# Patient Record
Sex: Male | Born: 1950 | Race: White | Hispanic: No | Marital: Single | Smoking: Former smoker
Health system: Southern US, Community
[De-identification: ages and names within clinical notes are randomized; demographics above are authoritative.]

## PROBLEM LIST (undated history)

## (undated) DIAGNOSIS — R112 Nausea with vomiting, unspecified: Secondary | ICD-10-CM

## (undated) DIAGNOSIS — M5126 Other intervertebral disc displacement, lumbar region: Secondary | ICD-10-CM

## (undated) DIAGNOSIS — N400 Enlarged prostate without lower urinary tract symptoms: Secondary | ICD-10-CM

## (undated) DIAGNOSIS — E785 Hyperlipidemia, unspecified: Secondary | ICD-10-CM

## (undated) DIAGNOSIS — F418 Other specified anxiety disorders: Secondary | ICD-10-CM

## (undated) DIAGNOSIS — M419 Scoliosis, unspecified: Secondary | ICD-10-CM

## (undated) DIAGNOSIS — M5136 Other intervertebral disc degeneration, lumbar region: Secondary | ICD-10-CM

## (undated) DIAGNOSIS — I10 Essential (primary) hypertension: Secondary | ICD-10-CM

## (undated) DIAGNOSIS — Z9889 Other specified postprocedural states: Secondary | ICD-10-CM

## (undated) DIAGNOSIS — K219 Gastro-esophageal reflux disease without esophagitis: Secondary | ICD-10-CM

## (undated) DIAGNOSIS — M51369 Other intervertebral disc degeneration, lumbar region without mention of lumbar back pain or lower extremity pain: Secondary | ICD-10-CM

## (undated) DIAGNOSIS — E538 Deficiency of other specified B group vitamins: Secondary | ICD-10-CM

## (undated) DIAGNOSIS — F209 Schizophrenia, unspecified: Secondary | ICD-10-CM

## (undated) DIAGNOSIS — T8859XA Other complications of anesthesia, initial encounter: Secondary | ICD-10-CM

## (undated) DIAGNOSIS — M5030 Other cervical disc degeneration, unspecified cervical region: Secondary | ICD-10-CM

## (undated) HISTORY — DX: Other cervical disc degeneration, unspecified cervical region: M50.30

## (undated) HISTORY — DX: Other specified anxiety disorders: F41.8

## (undated) HISTORY — DX: Other intervertebral disc degeneration, lumbar region: M51.36

## (undated) HISTORY — DX: Benign prostatic hyperplasia without lower urinary tract symptoms: N40.0

## (undated) HISTORY — DX: Deficiency of other specified B group vitamins: E53.8

## (undated) HISTORY — PX: TRACHEAL SURGERY: SHX1096

## (undated) HISTORY — DX: Other intervertebral disc degeneration, lumbar region without mention of lumbar back pain or lower extremity pain: M51.369

## (undated) HISTORY — PX: SPLENECTOMY, TOTAL: SHX788

## (undated) HISTORY — DX: Hyperlipidemia, unspecified: E78.5

## (undated) HISTORY — DX: Gastro-esophageal reflux disease without esophagitis: K21.9

## (undated) HISTORY — DX: Essential (primary) hypertension: I10

---

## 2000-06-09 ENCOUNTER — Ambulatory Visit (HOSPITAL_COMMUNITY): Admission: RE | Admit: 2000-06-09 | Discharge: 2000-06-09 | Payer: Self-pay | Admitting: *Deleted

## 2000-06-09 ENCOUNTER — Encounter: Payer: Self-pay | Admitting: *Deleted

## 2001-08-21 ENCOUNTER — Emergency Department (HOSPITAL_COMMUNITY): Admission: EM | Admit: 2001-08-21 | Discharge: 2001-08-22 | Payer: Self-pay | Admitting: Emergency Medicine

## 2001-08-21 ENCOUNTER — Encounter: Payer: Self-pay | Admitting: Emergency Medicine

## 2001-09-14 ENCOUNTER — Encounter: Payer: Self-pay | Admitting: Emergency Medicine

## 2001-09-14 ENCOUNTER — Emergency Department (HOSPITAL_COMMUNITY): Admission: EM | Admit: 2001-09-14 | Discharge: 2001-09-14 | Payer: Self-pay | Admitting: Emergency Medicine

## 2008-10-29 ENCOUNTER — Emergency Department (HOSPITAL_COMMUNITY): Admission: EM | Admit: 2008-10-29 | Discharge: 2008-10-29 | Payer: Self-pay | Admitting: Emergency Medicine

## 2009-07-07 ENCOUNTER — Ambulatory Visit: Payer: Self-pay | Admitting: Vascular Surgery

## 2009-10-10 ENCOUNTER — Emergency Department (HOSPITAL_COMMUNITY): Admission: EM | Admit: 2009-10-10 | Discharge: 2009-10-10 | Payer: Self-pay | Admitting: Emergency Medicine

## 2009-10-14 ENCOUNTER — Telehealth: Payer: Self-pay | Admitting: Internal Medicine

## 2009-10-14 ENCOUNTER — Encounter: Payer: Self-pay | Admitting: Internal Medicine

## 2009-10-15 DIAGNOSIS — I319 Disease of pericardium, unspecified: Secondary | ICD-10-CM | POA: Insufficient documentation

## 2009-10-15 DIAGNOSIS — K219 Gastro-esophageal reflux disease without esophagitis: Secondary | ICD-10-CM

## 2009-10-15 DIAGNOSIS — I10 Essential (primary) hypertension: Secondary | ICD-10-CM | POA: Insufficient documentation

## 2009-10-16 ENCOUNTER — Ambulatory Visit: Payer: Self-pay | Admitting: Internal Medicine

## 2009-10-16 DIAGNOSIS — F200 Paranoid schizophrenia: Secondary | ICD-10-CM | POA: Insufficient documentation

## 2009-10-16 DIAGNOSIS — J984 Other disorders of lung: Secondary | ICD-10-CM | POA: Insufficient documentation

## 2009-10-17 ENCOUNTER — Ambulatory Visit (HOSPITAL_COMMUNITY): Admission: RE | Admit: 2009-10-17 | Discharge: 2009-10-17 | Payer: Self-pay | Admitting: Family Medicine

## 2009-10-22 ENCOUNTER — Encounter (HOSPITAL_COMMUNITY): Admission: RE | Admit: 2009-10-22 | Discharge: 2009-10-31 | Payer: Self-pay | Admitting: Family Medicine

## 2009-11-04 ENCOUNTER — Encounter (HOSPITAL_COMMUNITY)
Admission: RE | Admit: 2009-11-04 | Discharge: 2009-12-04 | Payer: Self-pay | Source: Home / Self Care | Admitting: Family Medicine

## 2009-12-10 ENCOUNTER — Encounter (HOSPITAL_COMMUNITY)
Admission: RE | Admit: 2009-12-10 | Discharge: 2010-01-09 | Payer: Self-pay | Source: Home / Self Care | Attending: Family Medicine | Admitting: Family Medicine

## 2010-01-31 ENCOUNTER — Emergency Department (HOSPITAL_COMMUNITY)
Admission: EM | Admit: 2010-01-31 | Discharge: 2010-01-31 | Payer: Self-pay | Source: Home / Self Care | Admitting: Emergency Medicine
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
## 2010-03-03 NOTE — Letter (Signed)
Summary: [facility]  [facility]   Imported By: Lester Seligman 10/21/2009 08:50:04  _____________________________________________________________________  External Attachment:    Type:   Image     Comment:   External Document
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
## 2010-04-16 LAB — CBC
HCT: 36.2 % — ABNORMAL LOW (ref 39.0–52.0)
MCH: 30.5 pg (ref 26.0–34.0)
MCV: 88.2 fL (ref 78.0–100.0)
Platelets: 159 10*3/uL (ref 150–400)
RBC: 4.11 MIL/uL — ABNORMAL LOW (ref 4.22–5.81)
WBC: 6.6 10*3/uL (ref 4.0–10.5)

## 2010-04-16 LAB — BASIC METABOLIC PANEL
BUN: 3 mg/dL — ABNORMAL LOW (ref 6–23)
CO2: 26 mEq/L (ref 19–32)
Chloride: 102 mEq/L (ref 96–112)
Creatinine, Ser: 0.73 mg/dL (ref 0.4–1.5)
GFR calc Af Amer: >60 mL/min (ref >60)
Potassium: 3.3 mEq/L — ABNORMAL LOW (ref 3.5–5.1)

## 2010-04-16 LAB — DIFFERENTIAL
Eosinophils Absolute: 0 10*3/uL (ref 0.0–0.7)
Eosinophils Relative: 0 % (ref 0–5)
Lymphocytes Relative: 23 % (ref 12–46)
Lymphs Abs: 1.5 10*3/uL (ref 0.7–4.0)
Monocytes Absolute: 0.4 10*3/uL (ref 0.1–1.0)

## 2010-05-21 ENCOUNTER — Other Ambulatory Visit (HOSPITAL_COMMUNITY): Payer: Self-pay | Admitting: *Deleted

## 2010-05-21 DIAGNOSIS — J929 Pleural plaque without asbestos: Secondary | ICD-10-CM

## 2010-05-21 DIAGNOSIS — R222 Localized swelling, mass and lump, trunk: Secondary | ICD-10-CM

## 2010-05-25 ENCOUNTER — Ambulatory Visit (HOSPITAL_COMMUNITY): Admission: RE | Admit: 2010-05-25 | Payer: Medicare Other | Source: Ambulatory Visit

## 2010-06-16 NOTE — Consult Note (Signed)
NEW PATIENT CONSULTATION   Horrigan, Auguste  DOB:  12/02/1950                                       07/07/2009  CHART#:[id number]   Mr. Jim Collins is a 60 year old male patient referred by Dr. Charm Barges for  venous insufficiency of the right leg.  He has had bulging varicosities  both anteriorly in the lower thigh and pretibial region as well as  posterior calf area for several years.  He had no history of bleeding or  stasis ulcers but does have increasing swelling as the day goes by  particularly in the right ankle and discomfort associated with this  which is a heavy aching discomfort.  Has not worn elastic compression  stockings nor elevated his leg on a regular basis nor taken pain  medication except for his back.  Has no history of her other venous  problems.  Apparently the swelling in his right ankle is becoming an  issue.   PAST MEDICAL HISTORY:  1. Hypertension.  2. Lumbar disk disease.  3. Negative for diabetes, coronary artery disease, COPD or stroke.   FAMILY HISTORY:  Positive for coronary artery disease in mother who died  at a young age of myocardial infarction and diabetes in a brother.  Negative for stroke.   SOCIAL HISTORY:  Single.  He is disabled.  He smoked a pack cigarettes  per day until 15 years ago when he quit.  Drinks occasional beer.   REVIEW OF SYSTEMS:  Positive for weight loss, productive cough, reflux  esophagitis pain in his feet, dizziness, headaches, joint pain and  depression.  He states that he is a free bleeder but does not have a  specific diagnosis of hemophilia.  Review of systems otherwise is  totally negative in all systems.   PHYSICAL EXAMINATION:  Blood pressure 145/97, heart rate 70,  respirations 24.  GENERAL:  He is a well-developed, well-nourished male in no apparent  distress.  He is alert and oriented x3.  HEENT:  Exam is normal.  EOMs intact.  LUNGS:  Clear to auscultation.  No rhonchi or wheezing.  CARDIOVASCULAR:  Regular rhythm.  No murmurs.  ABDOMEN:  Soft, nontender with no masses.  MUSCULOSKELETAL:  Exam is free of major deformities.  NEUROLOGIC:  Normal.  SKIN:  Free of rashes.  Lower extremity exam reveals 3+ femoral, popliteal, and dorsalis pedis  pulses bilaterally.  There are some bulging varicosities in the right  anterior thigh beginning midway between the groin and the knee extending  into the anterior thigh and to the pretibial region around the knee.  He  also has some bulging varicosities in the posterior calf on the right.  The left leg has some small varicosities in the medial calf area in the  greater saphenous system.  There is no distal edema in the left leg but  there is distal edema in the right ankle which measures 1-1/2 cm larger  in circumference compared to the left.  There is mild hyperpigmentation.   Today I ordered a venous duplex exam of both lower extremities which I  have reviewed and interpreted.  Both great saphenous systems are free of  any reflux and have no valvular incompetence.  Deep systems are normal  bilaterally with no DVT.  Right small saphenous vein, however,, does  have gross reflux and is communicating with bulging varicosities  in the  calf.   I think his swelling may well be associated with the gross reflux in the  right small saphenous vein and that his leg fatigue and symptomatology  may be related to this as well.  We will treat him with long-leg elastic  compression stockings (20 mm-30 mm gradient) as well as elevation and  ibuprofen for the next 3 months.  If there has been no improvement, I  think we should proceed with laser ablation of the right small saphenous  vein to relieve these symptoms.     Quita Skye Hart Rochester, M.D.  Electronically Signed   JDL/MEDQ  D:  07/07/2009  T:  07/08/2009  Job:  [id number]

## 2010-06-16 NOTE — Procedures (Signed)
LOWER EXTREMITY VENOUS REFLUX EXAM   INDICATION:  Bilateral lower extremity varicose veins with swelling.  Patient states veins become raised above the skin as the day progresses  but expresses no lower extremity pain or swelling.   EXAM:  Using color-flow imaging and pulse Doppler spectral analysis, the  bilateral common femoral, superficial femoral, popliteal, posterior  tibial, greater and lesser saphenous veins are evaluated.  There is no  evidence suggesting deep venous insufficiency in the bilateral lower  extremities.   The bilateral saphenofemoral junction and greater saphenous veins are  competent.   The right proximal short saphenous vein demonstrates incompetency with  diameter measurements ranging from 0.64 to 0.68 cm.  The left proximal  short saphenous vein demonstrates competency.   GSV Diameter (used if found to be incompetent only)                                            Right    Left  Proximal Greater Saphenous Vein           cm       cm  Proximal-to-mid-thigh                     cm       cm  Mid thigh                                 cm       cm  Mid-distal thigh                          cm       cm  Distal thigh                              cm       cm  Knee                                      cm       cm   IMPRESSION:  1. No bilateral greater saphenous vein reflux is noted.  2. The bilateral deep venous system is competent.  3. The left proximal short saphenous vein demonstrates reflux of >500      milliseconds.  4. The right proximal short saphenous vein is competent.   ___________________________________________  Quita Skye. Hart Rochester, M.D.   CH/MEDQ  D:  07/07/2009  T:  07/07/2009  Job:  [id number]

## 2010-06-19 NOTE — Cardiovascular Report (Signed)
[place]  Patient:    Jim Collins, Jim Collins                          MRN: [id number] Proc. Date: 06/09/00 Adm. Date:  [id number] Disc. Date: [id number] Attending:  Daisey Must CC:         [facility], [place], [place]  Samuel Jester  Cardiac Catheterization Lab   Cardiac Catheterization  PROCEDURE PERFORMED:  Left heart catheterization with coronary angiography and left ventriculography.  INDICATIONS:  Mr. Roper is a 60 year old male with multiple cardiac risk factors.  He has had recurrent episodes of chest pain.  A stress Cardiolite study showed diaphragmatic attenuation but no obvious ischemia; however, because of his recurrent symptoms and presence of risk factors, he was referred for cardiac catheterization to rule out coronary artery disease.  PROCEDURAL NOTE:  A 6-French sheath was placed in the right femoral artery. Standard Judkins 6-French catheters were utilized.  Contrast was Omnipaque. At the conclusion of the procedure, a Perclose vascular closure device was placed in the right femoral artery with good hemostasis.  There were no complications.  RESULTS:  Hemodynamics:  Left ventricular pressure 125/17.  Aortic pressure 125/86. There was no aortic valve gradient.  Left ventriculogram:  Wall motion is normal.  Ejection fraction is calculated at 65%.  No mitral regurgitation.  Coronary arteriography codominant): 1. The left main is normal. 2. The left anterior descending artery gives rise to a normal sized first    diagonal and a small second diagonal.  The LAD is free of angiographic    disease. 3. The left circumflex is a codominant vessel.  It gives rise to a normal    sized ramus intermedius, a normal sized OM-1, small OM-2, and two small    posterolateral branches.  The left circumflex is free of angiographic    disease. 4. The right coronary artery is a codominant vessel.  It gives rise to a    large acute marginal which  supplies the distal portion of the inferior    septum.  There is a small posterior descending artery arising from the    distal right coronary artery.  IMPRESSIONS: 1. Normal left ventricular systolic function. 2. Angiographically normal coronary arteries.  The patients chest pain appears to be noncardiac in etiology. DD:  06/09/00 TD:  06/09/00 Job: [id number]

## 2011-01-04 ENCOUNTER — Encounter: Payer: Self-pay | Admitting: Internal Medicine

## 2011-01-05 ENCOUNTER — Ambulatory Visit: Payer: Medicare Other | Admitting: Gastroenterology

## 2011-01-19 ENCOUNTER — Ambulatory Visit: Payer: Medicare Other | Admitting: Gastroenterology

## 2011-02-08 ENCOUNTER — Ambulatory Visit: Payer: Medicare Other | Admitting: Gastroenterology

## 2011-02-08 ENCOUNTER — Telehealth: Payer: Self-pay | Admitting: Gastroenterology

## 2011-02-08 NOTE — Telephone Encounter (Signed)
Pt was a no show

## 2011-02-08 NOTE — Telephone Encounter (Signed)
Pt scheduled for 1/21. No-show today. Appears he may have cancelled or no-showed previously as well.

## 2011-02-22 ENCOUNTER — Ambulatory Visit: Payer: Medicare Other | Admitting: Gastroenterology

## 2011-02-22 ENCOUNTER — Telehealth: Payer: Self-pay | Admitting: Gastroenterology

## 2011-02-22 NOTE — Telephone Encounter (Signed)
PT WAS A NO SHOW 

## 2011-06-03 NOTE — Telephone Encounter (Signed)
Second documented no-show.  Please send letter re: not neglecting health. Was referred for screening colonoscopy.

## 2011-06-10 ENCOUNTER — Encounter: Payer: Self-pay | Admitting: Gastroenterology

## 2011-06-10 NOTE — Telephone Encounter (Signed)
Mailed letter for patient to call office to set up OV °

## 2015-06-14 ENCOUNTER — Emergency Department (HOSPITAL_COMMUNITY)
Admission: EM | Admit: 2015-06-14 | Discharge: 2015-06-14 | Disposition: A | Discharge status: Home / Self Care | Payer: Medicare Other | Attending: Emergency Medicine | Admitting: Emergency Medicine

## 2015-06-14 ENCOUNTER — Encounter (HOSPITAL_COMMUNITY): Payer: Self-pay | Admitting: *Deleted

## 2015-06-14 DIAGNOSIS — Y999 Unspecified external cause status: Secondary | ICD-10-CM | POA: Insufficient documentation

## 2015-06-14 DIAGNOSIS — Y9301 Activity, walking, marching and hiking: Secondary | ICD-10-CM | POA: Insufficient documentation

## 2015-06-14 DIAGNOSIS — E785 Hyperlipidemia, unspecified: Secondary | ICD-10-CM | POA: Diagnosis not present

## 2015-06-14 DIAGNOSIS — F329 Major depressive disorder, single episode, unspecified: Secondary | ICD-10-CM | POA: Diagnosis not present

## 2015-06-14 DIAGNOSIS — F209 Schizophrenia, unspecified: Secondary | ICD-10-CM | POA: Diagnosis not present

## 2015-06-14 DIAGNOSIS — Z87891 Personal history of nicotine dependence: Secondary | ICD-10-CM | POA: Diagnosis not present

## 2015-06-14 DIAGNOSIS — W540XXA Bitten by dog, initial encounter: Secondary | ICD-10-CM | POA: Insufficient documentation

## 2015-06-14 DIAGNOSIS — Z79899 Other long term (current) drug therapy: Secondary | ICD-10-CM | POA: Diagnosis not present

## 2015-06-14 DIAGNOSIS — Y929 Unspecified place or not applicable: Secondary | ICD-10-CM | POA: Diagnosis not present

## 2015-06-14 DIAGNOSIS — S81851A Open bite, right lower leg, initial encounter: Secondary | ICD-10-CM | POA: Insufficient documentation

## 2015-06-14 DIAGNOSIS — I10 Essential (primary) hypertension: Secondary | ICD-10-CM | POA: Insufficient documentation

## 2015-06-14 HISTORY — DX: Schizophrenia, unspecified: F20.9

## 2015-06-14 MED ORDER — AMOXICILLIN-POT CLAVULANATE 875-125 MG PO TABS
1.0000 | ORAL_TABLET | Freq: Once | ORAL | Status: AC
  Administered 2015-06-14: 1 via ORAL
  Filled 2015-06-14: qty 1

## 2015-06-14 MED ORDER — TETANUS-DIPHTH-ACELL PERTUSSIS 5-2.5-18.5 LF-MCG/0.5 IM SUSP
0.5000 mL | Freq: Once | INTRAMUSCULAR | Status: AC
  Administered 2015-06-14: 0.5 mL via INTRAMUSCULAR
  Filled 2015-06-14: qty 0.5

## 2015-06-14 MED ORDER — AMOXICILLIN-POT CLAVULANATE 875-125 MG PO TABS: 1.0000 | ORAL_TABLET | Freq: Two times a day (BID) | ORAL | Status: DC

## 2015-06-14 NOTE — ED Notes (Signed)
Pt comes in with dog bite to right shin. Pt states this bite occurred while walking in his neighborhood. Pt does not know the dog or dog owners.

## 2015-06-14 NOTE — ED Notes (Signed)
This nurse spoke with Burman FreestoneJohn Farris [facility] who states he is unable to find the dog. If pt can provide any more information contact him at [phone].  Otherwise, [facility] should [facility] after being medically treated.

## 2015-06-14 NOTE — ED Notes (Signed)
Per Officer Alanson AlyJohn Collins, patient discharged to area to meet with officer and verify house and dog that bit him. States he found the home with a truck that matches the description at the home.

## 2015-06-14 NOTE — ED Notes (Signed)
Cleaned wound with wound cleaner and sterile gauze. Patient tolerated well.

## 2015-06-14 NOTE — ED Notes (Signed)
Animal control made aware of situation.   Pt describes dog as a dachshund.

## 2015-06-14 NOTE — ED Provider Notes (Signed)
The pt states at 3:30 PM he was bitten by a small dog that was chained up at their house as he walked by - the pt states he was bitten X1 at the ankle -  Minimal pain - no bleedign at this time On exam has very small puncture / abrasion - no repairable wound - wound care given in ED - authorities made aware so that we can make decision on vaccine / treatment.  Medical screening examination/treatment/procedure(s) were conducted as a shared visit with non-physician practitioner(s) and myself.  I personally evaluated the patient during the encounter.  Clinical Impression:   Final diagnoses:  Dog bite         Eber HongBrian Abagale Boulos, MD 06/18/15 [phone]
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
## 2015-07-31 ENCOUNTER — Other Ambulatory Visit: Payer: Self-pay | Admitting: *Deleted

## 2015-07-31 DIAGNOSIS — I83893 Varicose veins of bilateral lower extremities with other complications: Secondary | ICD-10-CM

## 2015-10-09 ENCOUNTER — Encounter: Payer: Self-pay | Admitting: Vascular Surgery

## 2015-10-14 ENCOUNTER — Encounter: Payer: Medicare Other | Admitting: Vascular Surgery

## 2015-10-14 ENCOUNTER — Encounter (HOSPITAL_COMMUNITY): Payer: Self-pay

## 2015-10-14 ENCOUNTER — Inpatient Hospital Stay (HOSPITAL_COMMUNITY): Admission: RE | Admit: 2015-10-14 | Payer: Medicare Other | Source: Ambulatory Visit

## 2016-05-10 ENCOUNTER — Encounter (HOSPITAL_COMMUNITY): Payer: Self-pay | Admitting: *Deleted

## 2016-05-10 ENCOUNTER — Emergency Department (HOSPITAL_COMMUNITY): Payer: Medicare Other

## 2016-05-10 ENCOUNTER — Emergency Department (HOSPITAL_COMMUNITY)
Admission: EM | Admit: 2016-05-10 | Discharge: 2016-05-10 | Disposition: A | Discharge status: Home / Self Care | Payer: Medicare Other | Attending: Emergency Medicine | Admitting: Emergency Medicine

## 2016-05-10 DIAGNOSIS — Z87891 Personal history of nicotine dependence: Secondary | ICD-10-CM | POA: Diagnosis not present

## 2016-05-10 DIAGNOSIS — R110 Nausea: Secondary | ICD-10-CM | POA: Diagnosis not present

## 2016-05-10 DIAGNOSIS — K5900 Constipation, unspecified: Secondary | ICD-10-CM | POA: Diagnosis not present

## 2016-05-10 DIAGNOSIS — R1013 Epigastric pain: Secondary | ICD-10-CM | POA: Insufficient documentation

## 2016-05-10 DIAGNOSIS — I10 Essential (primary) hypertension: Secondary | ICD-10-CM | POA: Insufficient documentation

## 2016-05-10 DIAGNOSIS — R6883 Chills (without fever): Secondary | ICD-10-CM | POA: Insufficient documentation

## 2016-05-10 DIAGNOSIS — R1011 Right upper quadrant pain: Secondary | ICD-10-CM | POA: Insufficient documentation

## 2016-05-10 DIAGNOSIS — Z79899 Other long term (current) drug therapy: Secondary | ICD-10-CM | POA: Diagnosis not present

## 2016-05-10 HISTORY — DX: Scoliosis, unspecified: M41.9

## 2016-05-10 HISTORY — DX: Other intervertebral disc displacement, lumbar region: M51.26

## 2016-05-10 LAB — URINALYSIS, ROUTINE W REFLEX MICROSCOPIC
Bilirubin Urine: NEGATIVE
Glucose, UA: NEGATIVE mg/dL
Hgb urine dipstick: NEGATIVE
KETONES UR: NEGATIVE mg/dL
Leukocytes, UA: NEGATIVE
NITRITE: NEGATIVE
PH: 7 (ref 5.0–8.0)
PROTEIN: NEGATIVE mg/dL
Specific Gravity, Urine: 1.01 (ref 1.005–1.030)

## 2016-05-10 LAB — COMPREHENSIVE METABOLIC PANEL
ALBUMIN: 4.3 g/dL (ref 3.5–5.0)
ALT: 10 U/L — ABNORMAL LOW (ref 17–63)
ANION GAP: 9 (ref 5–15)
AST: 14 U/L — ABNORMAL LOW (ref 15–41)
Alkaline Phosphatase: 55 U/L (ref 38–126)
BUN: 6 mg/dL (ref 6–20)
CHLORIDE: 99 mmol/L — AB (ref 101–111)
CO2: 27 mmol/L (ref 22–32)
Calcium: 9.1 mg/dL (ref 8.9–10.3)
Creatinine, Ser: 0.79 mg/dL (ref 0.61–1.24)
GFR calc Af Amer: >60 mL/min (ref >60)
GFR calc non Af Amer: >60 mL/min (ref >60)
GLUCOSE: 93 mg/dL (ref 65–99)
POTASSIUM: 3.9 mmol/L (ref 3.5–5.1)
SODIUM: 135 mmol/L (ref 135–145)
Total Bilirubin: 0.7 mg/dL (ref 0.3–1.2)
Total Protein: 7 g/dL (ref 6.5–8.1)

## 2016-05-10 LAB — CBC
HEMATOCRIT: 39.6 % (ref 39.0–52.0)
HEMOGLOBIN: 14.2 g/dL (ref 13.0–17.0)
MCH: 31 pg (ref 26.0–34.0)
MCHC: 35.9 g/dL (ref 30.0–36.0)
MCV: 86.5 fL (ref 78.0–100.0)
Platelets: 191 10*3/uL (ref 150–400)
RBC: 4.58 MIL/uL (ref 4.22–5.81)
RDW: 12.8 % (ref 11.5–15.5)
WBC: 10.4 10*3/uL (ref 4.0–10.5)

## 2016-05-10 LAB — LIPASE, BLOOD: Lipase: 13 U/L (ref 11–51)

## 2016-05-10 MED ORDER — SUCRALFATE 1 G PO TABS: 1.0000 g | ORAL_TABLET | Freq: Three times a day (TID) | ORAL | 0 refills | Status: DC

## 2016-05-10 MED ORDER — GI COCKTAIL ~~LOC~~
30.0000 mL | Freq: Once | ORAL | Status: AC
  Administered 2016-05-10: 30 mL via ORAL
  Filled 2016-05-10: qty 30

## 2016-05-10 MED ORDER — IOPAMIDOL (ISOVUE-300) INJECTION 61%
INTRAVENOUS | Status: AC
  Administered 2016-05-10: 30 mL
  Filled 2016-05-10: qty 30

## 2016-05-10 MED ORDER — IOPAMIDOL (ISOVUE-300) INJECTION 61%
100.0000 mL | Freq: Once | INTRAVENOUS | Status: AC | PRN
  Administered 2016-05-10: 100 mL via INTRAVENOUS

## 2016-05-10 MED ORDER — ONDANSETRON 8 MG PO TBDP
8.0000 mg | ORAL_TABLET | Freq: Once | ORAL | Status: AC
  Administered 2016-05-10: 8 mg via ORAL
  Filled 2016-05-10: qty 1

## 2016-05-10 MED ORDER — ONDANSETRON 4 MG PO TBDP: 4.0000 mg | ORAL_TABLET | Freq: Three times a day (TID) | ORAL | 0 refills | Status: DC | PRN

## 2016-05-10 NOTE — ED Triage Notes (Addendum)
Pt's wife c/o "stomach burning and indigestion", nausea, abdominal pain that has been going on since Easter. Denies vomiting, diarrhea, fever. Pt has taken pepto-bismol with no relief.
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
## 2016-05-10 NOTE — ED Triage Notes (Signed)
Pt comes in with nausea since 4/1.

## 2016-05-10 NOTE — ED Notes (Signed)
Patient given water as po challenge

## 2016-05-10 NOTE — ED Notes (Signed)
Pt made aware to return if symptoms worsen or if any life threatening symptoms occur.   

## 2016-05-10 NOTE — ED Notes (Signed)
EKG given to Dr Pickering 

## 2016-05-10 NOTE — Discharge Instructions (Signed)
Take Zofran as needed for nausea Take Carafate for abdominal pain

## 2016-05-11 ENCOUNTER — Encounter (HOSPITAL_COMMUNITY): Payer: Self-pay | Admitting: Medical
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
## 2019-02-27 ENCOUNTER — Other Ambulatory Visit: Payer: Self-pay

## 2019-02-27 ENCOUNTER — Ambulatory Visit (INDEPENDENT_AMBULATORY_CARE_PROVIDER_SITE_OTHER): Payer: Medicare Other | Admitting: Gastroenterology

## 2019-02-27 ENCOUNTER — Encounter: Payer: Self-pay | Admitting: Gastroenterology

## 2019-02-27 VITALS — BP 141/87 | HR 58 | Temp 97.3°F | Ht 67.0 in | Wt 178.4 lb

## 2019-02-27 DIAGNOSIS — K219 Gastro-esophageal reflux disease without esophagitis: Secondary | ICD-10-CM | POA: Diagnosis not present

## 2019-02-27 DIAGNOSIS — R1013 Epigastric pain: Secondary | ICD-10-CM

## 2019-02-27 NOTE — Patient Instructions (Signed)
PA for EGD submitted via [facility] website. Case approved. PA# [id number], valid 03/28/19-06/26/19.

## 2019-02-27 NOTE — Patient Instructions (Signed)
Llamar a su farmacia para actualizar sus medicamentos actuales y decidir qu medicamento nuevo comenzar para sus sntomas estomacales. Les pedir que entreguen.  Contine el sulfato antes de las comidas y antes de acostarse segn sea necesario para el ardor de Teaching laboratory technician.  Por favor, haga sus anlisis de laboratorio.  Evite los polvos BC tanto como sea posible. Pueden causar lceras de estmago.  Endoscopia superior segn lo programado. consulte las instrucciones por separado.    I will call your pharmacy to update your current medications and decide what new medication to start for your stomach symptoms. I will ask they deliver.   Continue sulcrfate before meals and at bedtime as needed for stomach burning.   Please have your labs done.   Avoid BC Powders as much as possible. They can cause stomach ulcers.   Upper endoscopy as scheduled. see separate instructions.     Gastritis - Adultos Gastritis, Adult La gastritis es la inflamacin del estmago. Hay dos tipos de gastritis:  Gastritis aguda. Este tipo aparece de manera repentina.  Gastritis crnica. Este tipo es mucho ms frecuente y Environmental consultant. La gastritis se manifiesta cuando el revestimiento del estmago se debilita o se lesiona. Sin tratamiento, la gastritis puede causar sangrado y lceras estomacales. Cules son las causas? Esta afeccin puede ser causada por lo siguiente:  Infeccin.  Beber alcohol en exceso.  Ciertos medicamentos. Estos incluyen corticoesteroides, antibiticos y algunos medicamentos de venta sin receta, como aspirina o ibuprofeno.  Hay demasiado cido en el estmago.  Una enfermedad del intestino o del estmago.  Estrs.  Una reaccin alrgica.  Enfermedad de Crohn.  Algunos tratamientos para el cncer (radiacin). A veces, se desconoce la causa de esta afeccin. Cules son los signos o los sntomas? Los sntomas de esta afeccin incluyen los siguientes:  Dolor o sensacin de  ardor en la parte superior del abdomen.  Nuseas.  Vmitos.  Sensacin molesta de saciedad despus de comer.  Prdida de peso.  Mal aliento.  Sangre en el vmito o las heces. En algunos casos, no hay sntomas. Cmo se diagnostica? Esta afeccin puede diagnosticarse en funcin de lo siguiente:  Sus antecedentes mdicos y [place] descripcin de sus sntomas.  Un examen fsico.  Estudios. Estos pueden incluir los siguientes: ? Anlisis de [place]. ? Pruebas de heces. ? Un estudio en el que se introduce un instrumento delgado y flexible con Neomia Dear luz y Neomia Dear pequea cmara a travs del esfago hasta el estmago (endoscopa alta). ? Un estudio en el cual se toma una muestra de tejido para analizarlo (biopsia). Cmo se trata? Esta afeccin se puede tratar con medicamentos. Los medicamentos que se utilizan varan segn la causa de la gastritis:  Si la afeccin se debe a una infeccin bacteriana, pueden recetarle medicamentos antibiticos.  Si la afeccin se debe al exceso de cido estomacal, se pueden administrar medicamentos denominados bloqueadores H2, inhibidores de la bomba de protones o anticidos. El tratamiento puede tambin incluir interrumpir el uso de ciertos medicamentos como aspirina, ibuprofeno u otros antiinflamatorios no esteroideos (AINE). Siga estas indicaciones en su casa: Medicamentos  [employer] de venta libre y los recetados solamente como se lo haya indicado el mdico.  Si le recetaron un antibitico, tmelo como se lo haya indicado el mdico. No deje de tomar el antibitico, aunque comience a Actor. Comida y bebida   Haga comidas pequeas y frecuentes durante el da en lugar de comidas abundantes.  Evite los alimentos y las bebidas que intensifican los  sntomas.  Beba suficiente lquido como para Pharmacologist la orina de color amarillo plido. Consumo de alcohol  No beba alcohol si: ? Su mdico le indica no hacerlo. ? Est embarazada, puede  estar embarazada o est tratando de quedar embarazada.  Si bebe alcohol: ? Limite su uso a las siguientes medidas:  De 0 a 1 medida por da para las mujeres.  De 0 a 2 medidas por da para los hombres. ? Est atento a la cantidad de alcohol que hay en las bebidas que toma. En los [place], una medida equivale a una botella de cerveza de 12oz ( ), un vaso de vino de 5oz ( ) o un vaso de una bebida alcohlica de alta graduacin de 1oz (38ml). Indicaciones generales  Hable con el mdico [employer] formas de controlar el estrs, Lexicographer ejercicio con regularidad o practicar respiracin profunda, meditacin o yoga.  No consuma ningn producto que contenga nicotina o tabaco, como cigarrillos y Administrator, Civil Service. Si necesita ayuda para dejar de fumar, consulte al mdico.  Concurra a todas las visitas de seguimiento como se lo haya indicado el mdico. Esto es importante. Comunquese con un mdico si:  Sus sntomas empeoran.  Los sntomas regresan despus del tratamiento. Solicite ayuda inmediatamente si:  Vomita sangre de color rojo brillante o una sustancia similar a los granos de caf.  Las heces son negras o de color rojo oscuro.  No puede retener los lquidos.  El dolor abdominal empeora.  Tiene fiebre.  No se siente mejor luego de [employer]. Resumen  La gastritis es la inflamacin del revestimiento del estmago que puede ocurrir de Wellsite geologist repentina [place]) o desarrollarse lentamente con el tiempo (crnica).  Esta afeccin se diagnostica mediante los antecedentes mdicos, un examen fsico o estudios.  Esta afeccin puede tratarse con medicamentos para tratar la infeccin o medicamentos para reducir la cantidad de Pharmacologist.  Siga las indicaciones del mdico acerca de tomar medicamentos, hacer cambios en la dieta y saber cundo pedir ayuda. Esta informacin no tiene Theme park manager el consejo del mdico. Asegrese de hacerle al  mdico cualquier pregunta que tenga. Document Revised: 07/27/2017 Document Reviewed: 07/27/2017 Elsevier Patient Education  2020 [employer].    Dieta suave [place] Diet Una dieta suave se compone de alimentos que, en general, son blandos y no tienen [place] grasa, fibra ni condimentos adicionales. Para el cuerpo, es ms fcil digerir alimentos sin grasa, fibra o condimentos. Adems, es menos probable que estos causen [employer] boca, la garganta, el [place] y otras partes de aparato digestivo. A menudo, se conoce a la dieta [address] ([place], [place], Applesauce, Gray, arroz, pur de [place] y pan tostado]). En qu consiste el plan? Su mdico o especialista en nutricin y alimentos (nutricionista) pueden recomendar cambios especficos en su dieta para evitar o tratar sus sntomas. Estos cambios pueden incluir lo siguiente:  Consumir pequeas cantidades de comida con frecuencia.  Cocinar los alimentos hasta que estn lo bastante blandos para masticarlos con facilidad.  Masticar bien la comida.  Beber lquidos lentamente.  No consumir alimentos muy picantes, cidos o grasosos.  No comer frutas ctricas, como naranjas y toronjas. Qu debo saber acerca de esta dieta?  Consuma diferentes alimentos de la lista de alimentos de la dieta Arabi.  No siga una dieta suave durante ms tiempo del necesario.  Pregntele a su mdico si debe tomar vitaminas o suplementos. Qu alimentos puedo comer? Cereales  Cereales calientes, como crema de trigo.  Arroz. Panes, galletas o tortillas elaborados con harina blanca refinada. Verduras Verduras cocidas o enlatadas. Pur de papas o papas hervidas. Lambert Mody  Bananas. Pur de [employer]. Otros tipos de frutas cocidas o enlatadas peladas y sin semillas, por ejemplo, duraznos o peras en lata. Carnes y otras protenas  Huevos revueltos. [place] de man cremosa u otras mantequillas de frutos secos. Carnes [employer] cocidas,  como ave o pescado. Tofu. [place] o caldos. Lcteos Productos lcteos sin grasa, como [place], queso cottage y Estate agent. [employer]. T de hierbas. Jugo de [place]. Grasas y aceites Aderezos suaves para ensaladas. Aceite de canola o de oliva. Dulces y postres Pudin. Natillas. Gelatina de frutas. Helados. Es posible que los productos mencionados arriba no formen una lista completa de las bebidas o los alimentos recomendados. Comunquese con un nutricionista para conocer ms opciones. Qu alimentos no se recomiendan? Cereales Panes y cereales integrales. Verduras Verduras crudas. Frutas Frutas crudas, especialmente los ctricos, frutos rojos o frutas secas. Lcteos Productos lcteos enteros. Bebidas Bebidas que contengan cafena. Alcohol. Alios y condimentos Aderezos o condimentos muy saborizados. Salsa picante. Salsa. Otros alimentos Comidas picantes. Comidas fritas. Alimentos cidos, como encurtidos o alimentos fermentados. Alimentos con alto contenido de Location manager. Alimentos ricos [employer]. Es posible que los productos que se enumeran ms arriba no sean una lista completa de los alimentos y las bebidas que se Higher education careers adviser. Comunquese con un nutricionista para obtener ms informacin. Resumen  Una dieta suave se compone de alimentos que, en general, son blandos y no tienen [place] grasa, fibra ni condimentos adicionales.  Para el cuerpo, es ms fcil digerir alimentos sin grasa, fibra o condimentos.  Consulte a su mdico para ver cunto tiempo debe seguir este plan de alimentacin. Esta dieta no est destinada a seguirse [employer]. Esta informacin no tiene Marine scientist el consejo del mdico. Asegrese de hacerle al mdico cualquier pregunta que tenga. Document Revised: 03/24/2017 Document Reviewed: 03/24/2017 Elsevier Patient Education  2020 [employer].

## 2019-02-27 NOTE — Assessment & Plan Note (Signed)
Pleasant 69 year old Hispanic speaking male presenting for acute on chronic epigastric burning, heartburn refractory to therapy.  Has been on Protonix for over 10 years per his report.  States he was on recently when he had to go to the urgent care for worsening symptoms.  Started on Carafate and at this time he stopped the Protonix.  There is some concern about reliability of his medication list.  We are reaching out to his pharmacy to get accurate list.  At that time I will prescribe an appropriate PPI.  Patient can continue Carafate 4 times daily as needed.  We will update labs to evaluate abdominal pain.  Plan for an upper endoscopy in the near future for refractory reflux, epigastric pain.  I have discussed the risks, alternatives, benefits with regards to but not limited to the risk of reaction to medication, bleeding, infection, perforation and the patient is agreeable to proceed. Written consent to be obtained.  Bland diet in the interim.

## 2019-02-28 ENCOUNTER — Telehealth: Payer: Self-pay | Admitting: Gastroenterology

## 2019-02-28 MED ORDER — OMEPRAZOLE 20 MG PO CPDR: 20.0000 mg | DELAYED_RELEASE_CAPSULE | Freq: Two times a day (BID) | ORAL | 3 refills | Status: DC

## 2019-02-28 NOTE — Telephone Encounter (Signed)
Patient called and wanted to ask Verlon Au if she knew what medication she was going to start him on regarding his "stomach problems"  Routing to [place]  [phone]

## 2019-02-28 NOTE — Addendum Note (Signed)
Addended by: Tiffany Kocher on: 02/28/2019 10:36 PM   Modules accepted: Orders

## 2019-02-28 NOTE — Telephone Encounter (Signed)
Please request current med list from [facility] pharmacy. Need as soon as possible.

## 2019-02-28 NOTE — Telephone Encounter (Signed)
Medication list was requested yesterday. I will call them back.

## 2019-02-28 NOTE — Telephone Encounter (Signed)
Called [facility] and they are sending list

## 2019-02-28 NOTE — Telephone Encounter (Signed)
Updated medication list.   Sent in RX for omeprazole 20mg  bid before meals.   Please let pt know that rx sent to [facility] and I requested delivery for him per his original request.

## 2019-03-01 NOTE — Telephone Encounter (Signed)
Tried calling pt. No VM, will call pt back.  

## 2019-03-01 NOTE — Telephone Encounter (Signed)
Spoke with [employer]. She was notified of the RX LSL sent to pts pharmacy for delivery. She was also given instructions on how pt needs to take this medication.

## 2019-03-26 ENCOUNTER — Other Ambulatory Visit: Payer: Self-pay

## 2019-03-26 ENCOUNTER — Other Ambulatory Visit (HOSPITAL_COMMUNITY)
Admission: RE | Admit: 2019-03-26 | Discharge: 2019-03-26 | Disposition: A | Discharge status: Home / Self Care | Payer: Medicare Other | Source: Ambulatory Visit | Attending: Internal Medicine | Admitting: Internal Medicine

## 2019-03-26 DIAGNOSIS — Z20822 Contact with and (suspected) exposure to covid-19: Secondary | ICD-10-CM | POA: Diagnosis not present

## 2019-03-26 DIAGNOSIS — Z01812 Encounter for preprocedural laboratory examination: Secondary | ICD-10-CM | POA: Insufficient documentation

## 2019-03-26 LAB — SARS CORONAVIRUS 2 (TAT 6-24 HRS): SARS Coronavirus 2: NEGATIVE

## 2019-03-28 ENCOUNTER — Other Ambulatory Visit: Payer: Self-pay

## 2019-03-28 ENCOUNTER — Encounter: Payer: Self-pay | Admitting: Internal Medicine

## 2019-03-28 ENCOUNTER — Ambulatory Visit (HOSPITAL_COMMUNITY)
Admission: RE | Admit: 2019-03-28 | Discharge: 2019-03-28 | Disposition: A | Discharge status: Home / Self Care | Payer: Medicare Other | Attending: Internal Medicine | Admitting: Internal Medicine

## 2019-03-28 ENCOUNTER — Encounter (HOSPITAL_COMMUNITY): Payer: Self-pay | Admitting: Internal Medicine

## 2019-03-28 ENCOUNTER — Encounter (HOSPITAL_COMMUNITY)
Admission: RE | Disposition: A | Discharge status: Home / Self Care | Payer: Self-pay | Source: Home / Self Care | Attending: Internal Medicine

## 2019-03-28 DIAGNOSIS — R110 Nausea: Secondary | ICD-10-CM | POA: Diagnosis not present

## 2019-03-28 DIAGNOSIS — F209 Schizophrenia, unspecified: Secondary | ICD-10-CM | POA: Insufficient documentation

## 2019-03-28 DIAGNOSIS — Z87891 Personal history of nicotine dependence: Secondary | ICD-10-CM | POA: Diagnosis not present

## 2019-03-28 DIAGNOSIS — K449 Diaphragmatic hernia without obstruction or gangrene: Secondary | ICD-10-CM | POA: Insufficient documentation

## 2019-03-28 DIAGNOSIS — R1013 Epigastric pain: Secondary | ICD-10-CM | POA: Diagnosis not present

## 2019-03-28 DIAGNOSIS — R12 Heartburn: Secondary | ICD-10-CM | POA: Diagnosis not present

## 2019-03-28 DIAGNOSIS — K30 Functional dyspepsia: Secondary | ICD-10-CM

## 2019-03-28 HISTORY — PX: ESOPHAGOGASTRODUODENOSCOPY: SHX5428

## 2019-03-28 SURGERY — EGD (ESOPHAGOGASTRODUODENOSCOPY)
Anesthesia: Moderate Sedation

## 2019-03-28 MED ORDER — LIDOCAINE VISCOUS HCL 2 % MT SOLN
OROMUCOSAL | Status: AC
  Filled 2019-03-28: qty 15

## 2019-03-28 MED ORDER — ONDANSETRON HCL 4 MG/2ML IJ SOLN
INTRAMUSCULAR | Status: AC
  Filled 2019-03-28: qty 2

## 2019-03-28 MED ORDER — LIDOCAINE VISCOUS HCL 2 % MT SOLN
OROMUCOSAL | Status: DC | PRN
  Administered 2019-03-28: 4 mL via OROMUCOSAL

## 2019-03-28 MED ORDER — ONDANSETRON HCL 4 MG/2ML IJ SOLN
INTRAMUSCULAR | Status: DC | PRN
  Administered 2019-03-28: 4 mg via INTRAVENOUS

## 2019-03-28 MED ORDER — STERILE WATER FOR IRRIGATION IR SOLN
Status: DC | PRN
  Administered 2019-03-28: 11:00:00 100 mL

## 2019-03-28 MED ORDER — MIDAZOLAM HCL 5 MG/5ML IJ SOLN
INTRAMUSCULAR | Status: AC
  Filled 2019-03-28: qty 10

## 2019-03-28 MED ORDER — MEPERIDINE HCL 100 MG/ML IJ SOLN
INTRAMUSCULAR | Status: DC | PRN
  Administered 2019-03-28: 25 mg via INTRAVENOUS

## 2019-03-28 MED ORDER — MEPERIDINE HCL 50 MG/ML IJ SOLN
INTRAMUSCULAR | Status: AC
  Filled 2019-03-28: qty 1

## 2019-03-28 MED ORDER — SODIUM CHLORIDE 0.9 % IV SOLN: INTRAVENOUS | Status: DC

## 2019-03-28 MED ORDER — MIDAZOLAM HCL 5 MG/5ML IJ SOLN
INTRAMUSCULAR | Status: DC | PRN
  Administered 2019-03-28 (×2): 2 mg via INTRAVENOUS

## 2019-03-28 NOTE — Op Note (Signed)
[facility] Patient Name: Jim Collins Procedure Date: 03/28/2019 10:54 AM MRN: [id number] Date of Birth: 1951/01/16 Attending MD: Gennette Pac , MD CSN: [id number] Age: 69 Admit Type: Outpatient Procedure:                Upper GI endoscopy Indications:              Dyspepsia, Heartburn Providers:                Gennette Pac, MD, Edrick Kins, RN, Dyann Ruddle Referring MD:              Medicines:                Midazolam 4 mg IV, Meperidine 25 mg IV Complications:            No immediate complications. Estimated Blood Loss:     Estimated blood loss: none. Procedure:                Pre-Anesthesia Assessment:                           - Prior to the procedure, a History and Physical                            was performed, and patient medications and                            allergies were reviewed. The patient's tolerance of                            previous anesthesia was also reviewed. The risks                            and benefits of the procedure and the sedation                            options and risks were discussed with the patient.                            All questions were answered, and informed consent                            was obtained. Prior Anticoagulants: The patient has                            taken no previous anticoagulant or antiplatelet                            agents. ASA Grade Assessment: II - A patient with                            mild systemic disease. After reviewing the risks  and benefits, the patient was deemed in                            satisfactory condition to undergo the procedure.                           After obtaining informed consent, the endoscope was                            passed under direct vision. Throughout the                            procedure, the patient's blood pressure, pulse, and                            oxygen saturations  were monitored continuously. The                            [id number]-H190 ([id number]) scope was introduced through the                            mouth, and advanced to the second part of duodenum.                            The upper GI endoscopy was accomplished without                            difficulty. The patient tolerated the procedure                            well. Scope In: 11:34:59 AM Scope Out: 11:38:03 AM Total Procedure Duration: 0 hours 3 minutes 4 seconds  Findings:      The examined esophagus was normal.      A small hiatal hernia was present.      The exam was otherwise without abnormality.      The duodenal bulb and second portion of the duodenum were normal. Impression:               - Normal esophagus.                           - Small hiatal hernia.                           - The examination was otherwise normal.                           - Normal duodenal bulb and second portion of the                            duodenum.                           - No specimens collected. Moderate Sedation:      Moderate (conscious) sedation was administered by the endoscopy nurse  and supervised by the endoscopist. The following parameters were       monitored: oxygen saturation, heart rate, blood pressure, respiratory       rate, EKG, adequacy of pulmonary ventilation, and response to care.       Total physician intraservice time was 9 minutes. Recommendation:           - Patient has a contact number available for                            emergencies. The signs and symptoms of potential                            delayed complications were discussed with the                            patient. Return to normal activities tomorrow.                            Written discharge instructions were provided to the                            patient.                           - Resume previous diet.                           - Continue present medications. Continue omeprazole                             20 mg daily as this is helped patient's symptoms                            significantly.                           - Return to my office in 6 months. Procedure Code(s):        --- Professional ---                           [phone], Esophagogastroduodenoscopy, flexible,                            transoral; diagnostic, including collection of                            specimen(s) by brushing or washing, when performed                            (separate procedure) Diagnosis Code(s):        --- Professional ---                           K44.9, Diaphragmatic hernia without obstruction or  gangrene                           R10.13, Epigastric pain                           R12, Heartburn CPT copyright 2019 American Medical Association. All rights reserved. The codes documented in this report are preliminary and upon coder review may  be revised to meet current compliance requirements. Gerrit Friends. Vennie Waymire, MD Gennette Pac, MD 03/28/2019 11:49:25 AM This report has been signed electronically. Number of Addenda: 0

## 2019-03-28 NOTE — Discharge Instructions (Signed)
EGD Discharge instructions Please read the instructions outlined below and refer to this sheet in the next few weeks. These discharge instructions provide you with general information on caring for yourself after you leave the hospital. Your doctor may also give you specific instructions. While your treatment has been planned according to the most current medical practices available, unavoidable complications occasionally occur. If you have any problems or questions after discharge, please call your doctor. ACTIVITY  You may resume your regular activity but move at a slower pace for the next 24 hours.   Take frequent rest periods for the next 24 hours.   Walking will help expel (get rid of) the air and reduce the bloated feeling in your abdomen.   No driving for 24 hours (because of the anesthesia (medicine) used during the test).   You may shower.   Do not sign any important legal documents or operate any machinery for 24 hours (because of the anesthesia used during the test).  NUTRITION  Drink plenty of fluids.   You may resume your normal diet.   Begin with a light meal and progress to your normal diet.   Avoid alcoholic beverages for 24 hours or as instructed by your caregiver.  MEDICATIONS  You may resume your normal medications unless your caregiver tells you otherwise.  WHAT YOU CAN EXPECT TODAY  You may experience abdominal discomfort such as a feeling of fullness or "gas" pains.  FOLLOW-UP  Your doctor will discuss the results of your test with you.  SEEK IMMEDIATE MEDICAL ATTENTION IF ANY OF THE FOLLOWING OCCUR:  Excessive nausea (feeling sick to your stomach) and/or vomiting.   Severe abdominal pain and distention (swelling).   Trouble swallowing.   Temperature over 101 F (37.8 C).   Rectal bleeding or vomiting of blood.    GERD information provided  Continue omeprazole 20 mg twice daily  Office visit with [place] in 6 months  At patient request spoke to  [employer] at [phone] and reviewed results.   Enfermedad de reflujo gastroesofgico en los adultos Gastroesophageal Reflux Disease, Adult El reflujo gastroesofgico (RGE) ocurre cuando el cido del estmago sube por el tubo que conecta la boca con el estmago (esfago). Normalmente, la comida baja por el esfago y se mantiene en el [address], donde se la digiere. Cuando una persona tiene RGE, los alimentos y el cido estomacal suelen volver al esfago. Usted puede tener una enfermedad llamada enfermedad de reflujo gastroesofgico (ERGE) si el reflujo:  Sucede a menudo.  Causa sntomas frecuentes o muy intensos.  Causa problemas tales como dao en el esfago. Cuando esto ocurre, el esfago duele y se hincha (inflama). Con el tiempo, la ERGE puede ocasionar pequeos agujeros (lceras) en el revestimiento del esfago. Cules son las causas? Esta afeccin se debe a un problema en el msculo que se encuentra entre el esfago y [place]. Cuando este msculo est dbil o no es normal, no se cierra correctamente para impedir que los alimentos y el cido regresen del Teaching laboratory technician. El msculo puede debilitarse debido a lo siguiente:  El consumo de [place].  [place].  Tener cierto tipo de hernia (hernia de hiato).  Consumo de alcohol.  Ciertos alimentos y bebidas, como caf, chocolate, cebollas y [place]. Qu incrementa el riesgo? Es ms probable que tenga esta afeccin si:  Tiene sobrepeso.  Tiene una enfermedad que afecta el tejido conjuntivo.  [place] antiinflamatorios no esteroideos (AINE). Cules son los signos o los sntomas? Los sntomas de esta afeccin incluyen:  Acidez estomacal.  Dificultad o dolor al tragar.  Sensacin de Warehouse manager un bulto en la garganta.  Sabor amargo en la boca.  Mal aliento.  Tener una gran cantidad de saliva.  Estmago inflamado o con Dentist.  Eructos.  Dolor en el pecho. El dolor de pecho puede deberse a distintas afecciones. Asegrese de  Science writer a su mdico si tiene Journalist, newspaper.  Falta de aire o respiracin ruidosa (sibilancias).  Tos constante (crnica) o durante la noche.  Desgaste de la superficie de los dientes (esmalte dental).  Prdida de peso. Cmo se trata? El tratamiento depender de la gravedad de los sntomas. El mdico puede sugerirle lo siguiente:  Cambios en la dieta.  Medicamentos.  [place]. Siga estas indicaciones en su casa: Comida y bebida   Siga una dieta como se lo haya indicado el mdico. Es posible que deba evitar alimentos y bebidas, por ejemplo: ? Caf y t (con o sin cafena). ? Bebidas que contengan alcohol. ? Bebidas energticas y deportivas. ? Bebidas gaseosas y refrescos. ? Chocolate y cacao. ? Menta y esencia de [place]. ? Ajo y cebolla. ? Rbano picante. ? Alimentos cidos y condimentados. Estos incluyen todos los tipos de pimientos, [place] en polvo, curry en polvo, vinagre, salsas picantes y [employer]. ? Ctricos y sus jugos, por ejemplo, naranjas, limones y limas. ? Alimentos que [employer]. Estos incluyen salsa roja, [place], salsa picante y pizza con salsa de [place]. ? Alimentos fritos y Lexicographer. Estos incluyen donas, papas fritas, papitas fritas de bolsa y aderezos con alto contenido de [place] ? Carnes con alto contenido de [place] Estas incluye los perros calientes, chuletas o costillas, embutidos, jamn y tocino. ? Productos lcteos ricos en grasas, como leche [place], Micro y [place] crema.  Consuma pequeas cantidades de comida con ms frecuencia. Evite consumir porciones abundantes.  Evite beber grandes cantidades de lquidos con las comidas.  Evite comer 2 o 3horas antes de acostarse.  Evite recostarse inmediatamente despus de comer.  No haga ejercicios enseguida despus de comer. Estilo de vida   No consuma ningn producto que contenga nicotina o tabaco. Estos incluyen cigarrillos, cigarrillos electrnicos y tabaco para Theatre manager. Si necesita  ayuda para dejar de fumar, consulte al [employer].  Intente reducir [employer] de estrs. Si necesita ayuda para hacer esto, consulte al mdico.  Si tiene sobrepeso, baje una cantidad de peso saludable para usted. Consulte a su mdico para bajar de peso de [employer]. Indicaciones generales  Est atento a cualquier cambio en los sntomas.  Tome los medicamentos de venta libre y los recetados solamente como se lo haya indicado el mdico. No tome aspirina, ibuprofeno ni otros AINE a menos que el mdico lo autorice.  Use ropa holgada. No use nada apretado alrededor de la cintura.  Levante (eleve) la cabecera de la cama aproximadamente 6pulgadas (15cm).  Evite inclinarse si al hacerlo empeoran los sntomas.  Concurra a todas las visitas de [address] se lo haya indicado el mdico. Esto es importante. Comunquese con un mdico si:  Aparecen nuevos sntomas.  Adelgaza y no sabe por qu.  Tiene problemas para tragar o le duele cuando traga.  Tiene sibilancias o tos persistente.  Los sntomas no mejoran con Scientist, research (medical).  Tiene la voz ronca. Solicite ayuda inmediatamente si:  [employer], el cuello, la [place], los dientes o la espalda.  Se siente transpirado, mareado o tiene una sensacin de desvanecimiento.  Siente falta de aire o dolor en el  pecho.  Vomita y el vmito tiene un aspecto similar a la sangre o a los posos de caf.  Pierde el conocimiento (se desmaya).  Las deposiciones (heces) son sanguinolentas o negras.  No puede tragar, beber o comer. Resumen  Si una persona tiene enfermedad de reflujo gastroesofgico (ERGE), los alimentos y el cido estomacal suben al esfago y causan sntomas o problemas tales como dao en el esfago.  El tratamiento depender de la gravedad de los sntomas.  Siga una Lexicographer se lo haya indicado el [place] todos los medicamentos solamente como se lo haya indicado el mdico. Esta informacin no tiene  Marine scientist el consejo del mdico. Asegrese de hacerle al mdico cualquier pregunta que tenga. Document Revised: 09/01/2017 Document Reviewed: 09/01/2017 Elsevier Patient Education  [place].

## 2019-03-28 NOTE — Interval H&P Note (Signed)
History and Physical Interval Note:  03/28/2019 11:12 AM  Jim Collins  has presented today for surgery, with the diagnosis of epigastric pain, GERD.  The various methods of treatment have been discussed with the patient and family. After consideration of risks, benefits and other options for treatment, the patient has consented to  Procedure(s) with comments: ESOPHAGOGASTRODUODENOSCOPY (EGD) (N/A) - 12:15PM as a surgical intervention.  The patient's history has been reviewed, patient examined, no change in status, stable for surgery.  I have reviewed the patient's chart and labs.  Questions were answered to the patient's satisfaction.     Jim Collins  Patient via interpreter says reflux/dyspepsia symptoms much better on omeprazole 20 mg twice daily.  No dysphagia.  Diagnostic EGD today per plan.  The risks, benefits, limitations, alternatives and imponderables have been reviewed with the patient. Potential for esophageal dilation, biopsy, etc. have also been reviewed.  Questions have been answered. All parties agreeable.

## 2019-04-16 ENCOUNTER — Telehealth: Payer: Self-pay | Admitting: Internal Medicine

## 2019-04-16 NOTE — Telephone Encounter (Signed)
Spoke with Jim Collins. Pt is taking Omeprazole 20 mg bid before meals.  Pt is having severe burning in his abdomen and nausea. Pt isn't taking anything for nausea. Pt is eating meals, but eats very little and states that he feels bad. Pt feels that the burning sensations are constant.

## 2019-04-16 NOTE — Telephone Encounter (Signed)
Pt's room mate, Harriett Sine, called to say that the medicine RMR put him on has been making him sick. I offered to make OV but she would like to speak with the nurse first. Please ask for her because she can translate to the patient. [phone]

## 2019-04-17 MED ORDER — ONDANSETRON HCL 4 MG PO TABS: 4.0000 mg | ORAL_TABLET | Freq: Four times a day (QID) | ORAL | 0 refills | Status: DC | PRN

## 2019-04-17 MED ORDER — DEXILANT 60 MG PO CPDR: 60.0000 mg | DELAYED_RELEASE_CAPSULE | Freq: Every day | ORAL | 1 refills | Status: DC

## 2019-04-17 NOTE — Telephone Encounter (Signed)
Noted. Pt is scheduled for 05/02/19 @ 3:30 with [facility]. Pt is aware that he is to d/c the Omperazole and start the Dexilant. Avoid ASA, Celebrex, ibuprofen.

## 2019-04-17 NOTE — Telephone Encounter (Signed)
Reviewed EGD done last month, unremarkable.   Let's stop omeprazole and have patient try a different PPI. RX for Dexilant sent.  RX for zofran for nausea as needed. Recommend NO Celebrex, aspirin powders, ibuprofen, naprosyn.   Make f/u ov in 2-3 weeks.

## 2019-04-26 NOTE — Telephone Encounter (Signed)
Received a VM from pts friend [place]. Pt is taking Dexilant as directed, 30 mins before breakfast daily. Pt is still having burning sensations and feels full all the time. Pt reports nausea and is taking Zofran before meals. Pt reports no vomiting.  Pt is avoiding spicy, greasy foods and states the burning won't stop.

## 2019-04-30 MED ORDER — SUCRALFATE 1 GM/10ML PO SUSP: 1.0000 g | Freq: Three times a day (TID) | ORAL | 0 refills | Status: DC

## 2019-04-30 NOTE — Addendum Note (Signed)
Addended by: Tiffany Kocher on: 04/30/2019 01:10 PM   Modules accepted: Orders

## 2019-04-30 NOTE — Telephone Encounter (Signed)
Let's add carafate qid for 10 days. rx sent. Keep upcoming ov.  If having chest pain, sob, sweating with symptoms then he should go to ED.

## 2019-04-30 NOTE — Telephone Encounter (Signed)
Spoke with pt and Harriett Sine. Pt is aware that he will add Carafate qid x 10 days. If symptoms worsen, he is to go to the ED.

## 2019-05-02 ENCOUNTER — Ambulatory Visit: Payer: Medicare Other | Admitting: Gastroenterology

## 2019-05-07 NOTE — Telephone Encounter (Signed)
Harriett Sine called office, pt will finish Carafate tomorrow. Carafate did help him. She wants to know if any further is needed.

## 2019-05-08 ENCOUNTER — Telehealth: Payer: Self-pay | Admitting: Internal Medicine

## 2019-05-08 MED ORDER — SUCRALFATE 1 GM/10ML PO SUSP: 1.0000 g | Freq: Three times a day (TID) | ORAL | 0 refills | Status: DC

## 2019-05-08 NOTE — Addendum Note (Signed)
Addended by: Tiffany Kocher on: 05/08/2019 08:23 AM   Modules accepted: Orders

## 2019-05-08 NOTE — Telephone Encounter (Signed)
PLEASE CALL PATIENT, HAS A MEDICATION QUESTION

## 2019-05-08 NOTE — Telephone Encounter (Signed)
Spoke with pt. He wanted to confirm the medication he was suppose to be taking and when his next apt was. Pt is aware that he is taking Dexilant 60 mg and Carafate was called in to use as needed. Pt's next apt is August 2021.

## 2019-05-08 NOTE — Telephone Encounter (Signed)
We can provide him one more RX of Carafate to have on hand if needed but no plans to continue long term. He should continue the Dexilant.

## 2019-05-08 NOTE — Telephone Encounter (Signed)
Called and informed Jim Collins.

## 2019-06-18 ENCOUNTER — Encounter (HOSPITAL_COMMUNITY): Payer: Self-pay | Admitting: Emergency Medicine

## 2019-06-18 ENCOUNTER — Other Ambulatory Visit: Payer: Self-pay

## 2019-06-18 ENCOUNTER — Emergency Department (HOSPITAL_COMMUNITY)
Admission: EM | Admit: 2019-06-18 | Discharge: 2019-06-19 | Disposition: A | Discharge status: Home / Self Care | Payer: Medicare Other | Attending: Emergency Medicine | Admitting: Emergency Medicine

## 2019-06-18 DIAGNOSIS — F320 Major depressive disorder, single episode, mild: Secondary | ICD-10-CM | POA: Insufficient documentation

## 2019-06-18 DIAGNOSIS — F209 Schizophrenia, unspecified: Secondary | ICD-10-CM | POA: Insufficient documentation

## 2019-06-18 DIAGNOSIS — I10 Essential (primary) hypertension: Secondary | ICD-10-CM | POA: Diagnosis not present

## 2019-06-18 DIAGNOSIS — Z79899 Other long term (current) drug therapy: Secondary | ICD-10-CM | POA: Insufficient documentation

## 2019-06-18 DIAGNOSIS — R462 Strange and inexplicable behavior: Secondary | ICD-10-CM | POA: Diagnosis present

## 2019-06-18 DIAGNOSIS — Z87891 Personal history of nicotine dependence: Secondary | ICD-10-CM | POA: Diagnosis not present

## 2019-06-18 DIAGNOSIS — Z046 Encounter for general psychiatric examination, requested by authority: Secondary | ICD-10-CM

## 2019-06-18 LAB — COMPREHENSIVE METABOLIC PANEL
ALT: 25 U/L (ref 0–44)
AST: 23 U/L (ref 15–41)
Albumin: 4.2 g/dL (ref 3.5–5.0)
Alkaline Phosphatase: 94 U/L (ref 38–126)
Anion gap: 8 (ref 5–15)
BUN: 15 mg/dL (ref 8–23)
CO2: 27 mmol/L (ref 22–32)
Calcium: 9.1 mg/dL (ref 8.9–10.3)
Chloride: 100 mmol/L (ref 98–111)
Creatinine, Ser: 0.69 mg/dL (ref 0.61–1.24)
GFR calc Af Amer: >60 mL/min (ref >60)
GFR calc non Af Amer: >60 mL/min (ref >60)
Glucose, Bld: 114 mg/dL — ABNORMAL HIGH (ref 70–99)
Potassium: 3.9 mmol/L (ref 3.5–5.1)
Sodium: 135 mmol/L (ref 135–145)
Total Bilirubin: 0.8 mg/dL (ref 0.3–1.2)
Total Protein: 7.3 g/dL (ref 6.5–8.1)

## 2019-06-18 LAB — CBC WITH DIFFERENTIAL/PLATELET
Abs Immature Granulocytes: 0.02 10*3/uL (ref 0.00–0.07)
Basophils Absolute: 0 10*3/uL (ref 0.0–0.1)
Basophils Relative: 0 %
Eosinophils Absolute: 0 10*3/uL (ref 0.0–0.5)
Eosinophils Relative: 0 %
HCT: 42.1 % (ref 39.0–52.0)
Hemoglobin: 14.4 g/dL (ref 13.0–17.0)
Immature Granulocytes: 0 %
Lymphocytes Relative: 21 %
Lymphs Abs: 1.5 10*3/uL (ref 0.7–4.0)
MCH: 30.5 pg (ref 26.0–34.0)
MCHC: 34.2 g/dL (ref 30.0–36.0)
MCV: 89.2 fL (ref 80.0–100.0)
Monocytes Absolute: 0.5 10*3/uL (ref 0.1–1.0)
Monocytes Relative: 7 %
Neutro Abs: 5.1 10*3/uL (ref 1.7–7.7)
Neutrophils Relative %: 72 %
Platelets: 237 10*3/uL (ref 150–400)
RBC: 4.72 MIL/uL (ref 4.22–5.81)
RDW: 12.4 % (ref 11.5–15.5)
WBC: 7.1 10*3/uL (ref 4.0–10.5)
nRBC: 0 % (ref 0.0–0.2)

## 2019-06-18 LAB — URINALYSIS, ROUTINE W REFLEX MICROSCOPIC
Bilirubin Urine: NEGATIVE
Glucose, UA: NEGATIVE mg/dL
Hgb urine dipstick: NEGATIVE
Ketones, ur: NEGATIVE mg/dL
Leukocytes,Ua: NEGATIVE
Nitrite: NEGATIVE
Protein, ur: NEGATIVE mg/dL
Specific Gravity, Urine: 1.009 (ref 1.005–1.030)
pH: 8 (ref 5.0–8.0)

## 2019-06-18 LAB — RAPID URINE DRUG SCREEN, HOSP PERFORMED
Amphetamines: NOT DETECTED
Barbiturates: NOT DETECTED
Benzodiazepines: POSITIVE — AB
Cocaine: NOT DETECTED
Opiates: NOT DETECTED
Tetrahydrocannabinol: NOT DETECTED

## 2019-06-18 LAB — SALICYLATE LEVEL: Salicylate Lvl: <7 mg/dL — ABNORMAL LOW (ref 7.0–30.0)

## 2019-06-18 LAB — ACETAMINOPHEN LEVEL: Acetaminophen (Tylenol), Serum: <10 ug/mL — ABNORMAL LOW (ref 10–30)

## 2019-06-18 LAB — ETHANOL: Alcohol, Ethyl (B): <10 mg/dL (ref <10)

## 2019-06-18 NOTE — ED Notes (Signed)
Pt's personal cell phone taken away. I cut the phone off and placed it in the locker with his clothing.
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
## 2019-06-18 NOTE — ED Triage Notes (Addendum)
Pt arrives with [facility] after being IVC'd by his girlfriend. Per paperwork pt has hx of schizophrenia and hasn't been taking his medications, pt has been seeing and talking to people that are not there.  Pt states that he takes his medication every day and that he is only talking to himself due to her hurting his feelings and now he doesn't want to talk to her.

## 2019-06-19 DIAGNOSIS — F209 Schizophrenia, unspecified: Secondary | ICD-10-CM | POA: Diagnosis not present

## 2019-06-19 MED ORDER — DILTIAZEM HCL ER COATED BEADS 180 MG PO CP24
180.0000 mg | ORAL_CAPSULE | Freq: Every day | ORAL | Status: DC
  Administered 2019-06-19: 180 mg via ORAL
  Filled 2019-06-19 (×2): qty 1

## 2019-06-19 MED ORDER — PANTOPRAZOLE SODIUM 40 MG PO TBEC
40.0000 mg | DELAYED_RELEASE_TABLET | Freq: Every day | ORAL | Status: DC
  Administered 2019-06-19: 40 mg via ORAL
  Filled 2019-06-19: qty 1

## 2019-06-19 MED ORDER — QUETIAPINE FUMARATE 25 MG PO TABS
25.0000 mg | ORAL_TABLET | Freq: Two times a day (BID) | ORAL | Status: DC
  Administered 2019-06-19: 25 mg via ORAL
  Filled 2019-06-19: qty 1

## 2019-06-19 MED ORDER — ATORVASTATIN CALCIUM 10 MG PO TABS
20.0000 mg | ORAL_TABLET | Freq: Every day | ORAL | Status: DC
  Administered 2019-06-19: 20 mg via ORAL
  Filled 2019-06-19: qty 2

## 2019-06-19 MED ORDER — ESCITALOPRAM OXALATE 10 MG PO TABS
10.0000 mg | ORAL_TABLET | Freq: Every day | ORAL | Status: DC
  Administered 2019-06-19: 10 mg via ORAL
  Filled 2019-06-19: qty 1

## 2019-06-19 NOTE — Discharge Instructions (Addendum)
Follow-up as instructed by behavioral health 

## 2019-06-19 NOTE — Progress Notes (Signed)
Patient ID: Jim Collins, male   DOB: October 23, 1950, 69 y.o.   MRN: [id number]   Psychiatric reassessment   ZSM:OLMB [name] is an 69 y.o. male.  -Clinician reviewed note by Jim Pitcher, PA.  Jim Collins a 69 y.o.malewith history of BPH, degenerative disc disease, GERD, hypertension, hyperlipidemia, schizophrenia presents for evaluation under IVC. Paperwork was taken out by reportedly the patient significant other. He states that this individual is not his significant other but his roommate. The paperwork states that the respondent fears for her life that the patient may harm her and that he has been exhibiting some somewhat bizarre behavior. It states that he has been talking to himself and has not been taking his medications. He states that he takes his medication every day and that he is not talking to himself but rather praying. He denies suicidal ideation, homicidal ideation or auditory or visual hallucinations. He denies any complaints at this time.  Patient needs a spanish interpreter.    Patient said he has known the roommate for many years but they have been roommates for only the last 3 months.  Patient says he plans on moving away from her and getting his own place.    Patient denies not taking his medications.  He says he prays and that is all he does that may be interpreted as talking to someone not present.  He denies seeing or hearing persons not present.    Patient denies any SI or HI.  He has not been using illicit drugs.  Patient does not have a psychiatrist or therapist.  He is oriented x4.  His eye contact is fair.  He is not showing signs of responding to internal stimuli.  Patient thought process is logical and coherent.  He is slightly agitated or frustrated about being IVC'ed by roommate.  Pt says that his appetite is normal and sleep is about 7-8 hours per night.   Psychiatric evaluation: Jim Collins is a 69 year old male who presented to [facility], IVC'd, for concerns  as noted above. Patient speaks mostly spanish so interpreter services were utilized. During this evaluation, he was alert and oriented x4, calm and cooperative. Patient denied SI, HI or psychosis. He stated that he was taken to the ED after his roommate took out papers. He stated that he and his roommate have been having issues and he no longer wants to live there. He admitted that there are times that he does talk to himself although refer to this as," praying." He stated that he is taking his medications, " morning and night."  He denied any previous psychiatric history. 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 He denied other substance abuse or use.Denied prior suicide attempts or events of self-harm.  He stated that his roommate has been frustrating him lately and his plans are not to return to the home. He gave verbal consent to speak to a friend, Jim Collins, to collect collateral informations.   Collateral information: I spoke to Jim Collins, who stated she is a frined of Mr Rogerson and the daughter-n-law of patients roommate. She stated," she (mother n law) is the one that she be there."  She added that patient has complained about his roommate behaviors for the past several weeks. Stated that patient does take his medications and that she does not know why his roommate said that in the IVC. Stated that she has never saw patient talking to himself (  in a psychotic manner)and she did not believe  that he was a danger to himself or others as he has never tried to harm anyone including himself. Stated that patient has voiced concerns about wanting to move from the house and the plans are for him to stay with her son tonight and she and patient work on other housing options tomorrow. She again, denied concerns for patients safety.   Disposition: Patient denied SI, HI, or psychosis. He denied reports that were made in the IVC. With verbal consent, I  obtained collateral information from Jim Collins, patients friend and she voiced no concerns for safety. She denied that patient was a danger to himself or others. She stated that she was told that patient does see a psychiatrist however, this was not confirmed. I advised her that resources  would be provided prior to patients discharge for outpatient psychiatric services.   Based off of my evaluation and collateral information, there is no evidence of imminent risk to self or others at present. Patient does not meet criteria for psychiatric inpatient admission and is psychiatrically cleared.   ED nurse updated on disposition. Jim Collins, patients friend  stated she could be to pick patient up after 6:00 pm today.

## 2019-06-19 NOTE — ED Provider Notes (Signed)
Blood pressure (!) 177/80, pulse (!) 102, temperature 98 F (36.7 C), temperature source Oral, resp. rate 19, height 5\' 7"  (1.702 m), weight 81.6 kg, SpO2 96 %.  Assuming care from Dr. .  In short, Jim Collins is a 69 y.o. male with a chief complaint of IVC .  Refer to the original H&P for additional details.  04:09 PM  Patient reevaluated and denies SI.  He was evaluated by psychiatry and cleared for discharge.  I rescinded the IVC paperwork.  Patient will be able to go at 6 PM when his ride is here.    73, MD 06/19/19 [phone]

## 2019-06-19 NOTE — ED Notes (Signed)
tts called to say they were ready to do consult but needed interpreter in room also. Interpreter is set up in room at this time.

## 2019-06-19 NOTE — ED Provider Notes (Signed)
Emergency Medicine Observation Re-evaluation Note  Jim Collins is a 69 y.o. male, seen on rounds today.  Pt initially presented to the ED for complaints of IVC Currently, the patient is awaiting placement  Physical Exam  BP (!) 177/80   Pulse (!) 102   Temp 98 F (36.7 C) (Oral)   Resp 19   Ht 5\' 7"  (1.702 m)   Wt 81.6 kg   SpO2 96%   BMI 28.19 kg/m  Physical Exam alert in no acute distress  ED Course / MDM  EKG:    I have reviewed the labs performed to date as well as medications administered while in observation.  Recent changes in the last 24 hours include none Plan  Current plan is for behavioral health decision on placement Patient is under full IVC at this time.   , MD 06/19/19 1319
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
## 2019-06-19 NOTE — ED Notes (Signed)
Pt requested Fleet Contras to be the collateral person instead of [place]. Nurse called [facility] and spoke with [facility] to make them aware of pt request. Pt is able to verbalize [employer] phone number.

## 2019-06-19 NOTE — ED Notes (Signed)
Pt is doing tts consult at this time.

## 2019-06-19 NOTE — ED Notes (Signed)
Jim Collins called and requested to come get pt's belongings.  Asked pt through the interpreter and pt said no, he did not want her to have his belongings.

## 2019-06-19 NOTE — ED Notes (Signed)
NP Clementeen Hoof with [facility] called and stated pt is cleared to go home with Fleet Contras today. Fleet Contras agreed to come and pick him up after 6 pm tonight when she gets off of work and pt can spend the night with her.

## 2019-06-19 NOTE — ED Notes (Signed)
Pt on the phone with Fleet Contras .

## 2019-07-03 ENCOUNTER — Encounter (HOSPITAL_COMMUNITY): Payer: Self-pay | Admitting: Emergency Medicine

## 2019-07-03 ENCOUNTER — Emergency Department (HOSPITAL_COMMUNITY)
Admission: EM | Admit: 2019-07-03 | Discharge: 2019-07-05 | Disposition: A | Discharge status: Other Acute Inpatient Hospital | Payer: Medicare Other | Attending: Emergency Medicine | Admitting: Emergency Medicine

## 2019-07-03 ENCOUNTER — Other Ambulatory Visit: Payer: Self-pay

## 2019-07-03 ENCOUNTER — Emergency Department (HOSPITAL_COMMUNITY): Payer: Medicare Other

## 2019-07-03 DIAGNOSIS — Z79899 Other long term (current) drug therapy: Secondary | ICD-10-CM | POA: Diagnosis not present

## 2019-07-03 DIAGNOSIS — R1084 Generalized abdominal pain: Secondary | ICD-10-CM | POA: Diagnosis present

## 2019-07-03 DIAGNOSIS — Z20822 Contact with and (suspected) exposure to covid-19: Secondary | ICD-10-CM | POA: Diagnosis not present

## 2019-07-03 DIAGNOSIS — K5900 Constipation, unspecified: Secondary | ICD-10-CM

## 2019-07-03 DIAGNOSIS — F333 Major depressive disorder, recurrent, severe with psychotic symptoms: Secondary | ICD-10-CM | POA: Diagnosis not present

## 2019-07-03 DIAGNOSIS — Z9114 Patient's other noncompliance with medication regimen: Secondary | ICD-10-CM | POA: Insufficient documentation

## 2019-07-03 LAB — CBC
HCT: 42.4 % (ref 39.0–52.0)
Hemoglobin: 14.3 g/dL (ref 13.0–17.0)
MCH: 30.4 pg (ref 26.0–34.0)
MCHC: 33.7 g/dL (ref 30.0–36.0)
MCV: 90.2 fL (ref 80.0–100.0)
Platelets: 263 10*3/uL (ref 150–400)
RBC: 4.7 MIL/uL (ref 4.22–5.81)
RDW: 13.2 % (ref 11.5–15.5)
WBC: 11.3 10*3/uL — ABNORMAL HIGH (ref 4.0–10.5)
nRBC: 0 % (ref 0.0–0.2)

## 2019-07-03 LAB — URINALYSIS, ROUTINE W REFLEX MICROSCOPIC
Bilirubin Urine: NEGATIVE
Glucose, UA: NEGATIVE mg/dL
Hgb urine dipstick: NEGATIVE
Ketones, ur: NEGATIVE mg/dL
Leukocytes,Ua: NEGATIVE
Nitrite: NEGATIVE
Protein, ur: NEGATIVE mg/dL
Specific Gravity, Urine: >1.046 — ABNORMAL HIGH (ref 1.005–1.030)
pH: 7 (ref 5.0–8.0)

## 2019-07-03 LAB — COMPREHENSIVE METABOLIC PANEL
ALT: 22 U/L (ref 0–44)
AST: 20 U/L (ref 15–41)
Albumin: 4.1 g/dL (ref 3.5–5.0)
Alkaline Phosphatase: 96 U/L (ref 38–126)
Anion gap: 7 (ref 5–15)
BUN: 12 mg/dL (ref 8–23)
CO2: 26 mmol/L (ref 22–32)
Calcium: 8.6 mg/dL — ABNORMAL LOW (ref 8.9–10.3)
Chloride: 107 mmol/L (ref 98–111)
Creatinine, Ser: 0.72 mg/dL (ref 0.61–1.24)
GFR calc Af Amer: >60 mL/min (ref >60)
GFR calc non Af Amer: >60 mL/min (ref >60)
Glucose, Bld: 93 mg/dL (ref 70–99)
Potassium: 3.9 mmol/L (ref 3.5–5.1)
Sodium: 140 mmol/L (ref 135–145)
Total Bilirubin: 1.1 mg/dL (ref 0.3–1.2)
Total Protein: 7.2 g/dL (ref 6.5–8.1)

## 2019-07-03 LAB — RAPID URINE DRUG SCREEN, HOSP PERFORMED
Amphetamines: NOT DETECTED
Barbiturates: NOT DETECTED
Benzodiazepines: POSITIVE — AB
Cocaine: NOT DETECTED
Opiates: NOT DETECTED
Tetrahydrocannabinol: POSITIVE — AB

## 2019-07-03 LAB — SARS CORONAVIRUS 2 BY RT PCR (HOSPITAL ORDER, PERFORMED IN ~~LOC~~ HOSPITAL LAB): SARS Coronavirus 2: NEGATIVE

## 2019-07-03 LAB — LIPASE, BLOOD: Lipase: 17 U/L (ref 11–51)

## 2019-07-03 LAB — ETHANOL: Alcohol, Ethyl (B): <10 mg/dL (ref <10)

## 2019-07-03 MED ORDER — ESCITALOPRAM OXALATE 10 MG PO TABS
10.0000 mg | ORAL_TABLET | Freq: Every day | ORAL | Status: DC
  Administered 2019-07-03 – 2019-07-05 (×3): 10 mg via ORAL
  Filled 2019-07-03 (×3): qty 1

## 2019-07-03 MED ORDER — POLYETHYLENE GLYCOL 3350 17 G PO PACK: 17.0000 g | PACK | Freq: Every day | ORAL | Status: DC | PRN

## 2019-07-03 MED ORDER — QUETIAPINE FUMARATE 25 MG PO TABS
25.0000 mg | ORAL_TABLET | Freq: Two times a day (BID) | ORAL | Status: DC
  Administered 2019-07-03 – 2019-07-05 (×4): 25 mg via ORAL
  Filled 2019-07-03 (×4): qty 1

## 2019-07-03 MED ORDER — SODIUM CHLORIDE 0.9 % IV BOLUS
1000.0000 mL | Freq: Once | INTRAVENOUS | Status: AC
  Administered 2019-07-03: 1000 mL via INTRAVENOUS

## 2019-07-03 MED ORDER — DILTIAZEM HCL ER COATED BEADS 180 MG PO CP24
180.0000 mg | ORAL_CAPSULE | Freq: Every day | ORAL | Status: DC
  Administered 2019-07-03 – 2019-07-05 (×3): 180 mg via ORAL
  Filled 2019-07-03 (×6): qty 1

## 2019-07-03 MED ORDER — ACETAMINOPHEN 325 MG PO TABS
650.0000 mg | ORAL_TABLET | Freq: Once | ORAL | Status: AC
  Administered 2019-07-03: 650 mg via ORAL
  Filled 2019-07-03: qty 2

## 2019-07-03 MED ORDER — IOHEXOL 300 MG/ML  SOLN
100.0000 mL | Freq: Once | INTRAMUSCULAR | Status: AC | PRN
  Administered 2019-07-03: 100 mL via INTRAVENOUS

## 2019-07-03 MED ORDER — LORAZEPAM 0.5 MG PO TABS
0.5000 mg | ORAL_TABLET | Freq: Once | ORAL | Status: AC
  Administered 2019-07-03: 0.5 mg via ORAL
  Filled 2019-07-03: qty 1

## 2019-07-03 NOTE — ED Notes (Signed)
Call from person who identifies as caregiver   States pt has stopped taking  His psych meds  '  He is going nutso" per care giver and states she had IVC taken out on his "but they couldn't find him"  Also reports he is drinking

## 2019-07-03 NOTE — ED Notes (Addendum)
PT is aware we need urine sample, urinal at bedside.  
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
## 2019-07-03 NOTE — ED Notes (Signed)
Pt states they "lost their mind 15 days ago".

## 2019-07-03 NOTE — ED Triage Notes (Signed)
Pt c/o of abdominal pain x 2 days

## 2019-07-03 NOTE — ED Notes (Signed)
Pt reports abd pain for the last 2 days   He also reports no BM in the last 2 days   Here for eval

## 2019-07-04 DIAGNOSIS — F333 Major depressive disorder, recurrent, severe with psychotic symptoms: Secondary | ICD-10-CM | POA: Diagnosis not present

## 2019-07-04 MED ORDER — ADULT MULTIVITAMIN W/MINERALS CH
1.0000 | ORAL_TABLET | Freq: Every day | ORAL | Status: DC
  Administered 2019-07-05: 1 via ORAL
  Filled 2019-07-04: qty 1

## 2019-07-04 MED ORDER — LOPERAMIDE HCL 2 MG PO CAPS: 2.0000 mg | ORAL_CAPSULE | ORAL | Status: DC | PRN

## 2019-07-04 MED ORDER — HYDROXYZINE HCL 25 MG PO TABS
25.0000 mg | ORAL_TABLET | Freq: Four times a day (QID) | ORAL | Status: DC | PRN
  Administered 2019-07-04: 25 mg via ORAL
  Filled 2019-07-04: qty 1

## 2019-07-04 MED ORDER — ONDANSETRON 4 MG PO TBDP
4.0000 mg | ORAL_TABLET | Freq: Four times a day (QID) | ORAL | Status: DC | PRN
  Administered 2019-07-04: 4 mg via ORAL
  Filled 2019-07-04: qty 1

## 2019-07-04 MED ORDER — QUETIAPINE FUMARATE 25 MG PO TABS
50.0000 mg | ORAL_TABLET | Freq: Every day | ORAL | Status: DC
  Administered 2019-07-04: 50 mg via ORAL
  Filled 2019-07-04: qty 2

## 2019-07-04 MED ORDER — OXYCODONE HCL 5 MG PO TABS
5.0000 mg | ORAL_TABLET | Freq: Once | ORAL | Status: AC
  Administered 2019-07-04: 5 mg via ORAL
  Filled 2019-07-04: qty 1

## 2019-07-04 MED ORDER — THIAMINE HCL 100 MG PO TABS
100.0000 mg | ORAL_TABLET | Freq: Every day | ORAL | Status: DC
  Administered 2019-07-05: 100 mg via ORAL
  Filled 2019-07-04: qty 1

## 2019-07-04 MED ORDER — LORAZEPAM 1 MG PO TABS
1.0000 mg | ORAL_TABLET | Freq: Four times a day (QID) | ORAL | Status: DC | PRN
  Administered 2019-07-04 – 2019-07-05 (×2): 1 mg via ORAL
  Filled 2019-07-04 (×2): qty 1

## 2019-07-04 NOTE — BH Assessment (Signed)
Per Marciano Sequin, NP, inpatient treatment is recommended. TTS to seek placement. Patient referred to the following facilities for consideration of bed placement.  [facility] Details    [facility] Details    [facility] Details    [facility] Details    [facility] Details    [facility] Details    [facility] Details    [facility] Details    [facility] Details    [facility] Details    [facility] Details    [facility] Details    [facility] Details    [facility] Details    [facility] Details    [facility] Details    [facility] Details    [facility] Details    [facility] Details    [facility] Details    [facility] Details    [facility] Details    [facility] Details    [facility] Details    [facility] Details    [facility]

## 2019-07-04 NOTE — Progress Notes (Signed)
Jim Collins is a 69 year old male with history of schizophrenia and alcohol use disorder. He was seen in [facility] on 06/19/19 under IVC from his roommate for bizarre behaviors and talking to himself but discharged at that time. He returned to the ED yesterday with complaint of abdominal pain but became agitated and tearful while waiting. His friend Jim Collins was called for collateral information and stated the patient had been more agitated over the last few weeks and had relapsed on alcohol for the first time in more than ten years.  Reassessment: Patient seen over telepsych with telehealth interpreter. He is a limited and confusing historian. He is irritable. He states that he came to the hospital due to "problems in my mind" that have been going on for the last 10 days and that he is "tired of people bothering me." He denies mental health history but states that he takes medication "for my nerves." He is unsure of names of medications. PDMP shows history of oxycodone prescriptions but no BZDs rx. He initially states he is medication compliant but later in the interview says he is not taking medications.  Patient states he has been sober from alcohol for 27 years but started drinking again on Saturday. He states he drank 4 beers on Saturday and Monday, with last drink on Monday. He denies drug use. Admission BAL <10. UDS positive for BZDs and THC, but it does appear patient received Ativan in the ED prior to drug screen.  Patient is oriented x3 but sometimes provides irrelevant answers and at times stops responding to questions and looks away. When asked what year it is, he initially responds, "Sometimes my stomach hurts when I drink." When asked for his location, he states "I stopped taking my medications the other day." He is able to answer appropriately after questions are repeated. He denies AVH. It appears he may be responding to internal stimuli at times. He denies SI/HI, although prior notes indicate  he reported SI yesterday. He admits to depressed mood and decreased sleep.  Collateral information: With patient's verbal consent, collateral information was obtained from Jim Collins, [phone]), his friend for 15 years. She states that he has history of schizophrenia and has been off medications since at least May 17. She does not know what medications he usually takes. She states the patient is typically mild-mannered but became aggressive and was screaming at her on Thursday. She was attempting to help him find a place to live, but he was yelling that he would not stay there. Ms. Jim Collins took him to a hotel to stay for the night, but the hotel called her stating he must leave due to disruptive behaviors. When she arrived to pick him up, he threw his bag at her, cursed, and refused to remove his things from the room. She filled out IVC paperwork this weekend, but the police were unable to find him before the IVC expired. She verifies patient has been sober for years but believes he relapsed on alcohol this weekend.  Disposition: Recommend geri-psych hospitalization for stabilization. Will start Ativan CIWA protocol in case of alcohol withdrawal. Home medication list shows Seroquel 25 mg BID, which he has received in the ED. Will continue Seroquel 25 BID and add Seroquel 50 mg QHS. CSW and ED RN updated.

## 2019-07-04 NOTE — BH Assessment (Addendum)
Per Delaney Meigs, patient accepted to [facility] for admission 07/05/2019 (anytime after 9am). The accepting provider is Dr. Estill Cotta. Nurse report [phone]. Patient's nurse Merry Proud was updated. Patient is voluntary at this time. IVC will be initiated for sheriff transport.
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
## 2019-07-04 NOTE — ED Notes (Signed)
Pt has 2 bags total. 1 Bag of clothing and pt. Shoes & another bag with cell phone. Both bags are labeled and locked up in locker.

## 2019-07-04 NOTE — ED Notes (Signed)
Pt was able to take a shower per request. Pt given new scrubs, socks, and bed linens changed.

## 2019-07-05 DIAGNOSIS — F333 Major depressive disorder, recurrent, severe with psychotic symptoms: Secondary | ICD-10-CM | POA: Diagnosis not present

## 2019-07-05 LAB — URINE CULTURE: Culture: <10000 — AB

## 2019-07-05 NOTE — ED Notes (Signed)
[employer] [facility] for transport to [facility]

## 2019-07-05 NOTE — ED Notes (Signed)
Report given to Kelly RN 

## 2019-08-17 ENCOUNTER — Other Ambulatory Visit: Payer: Self-pay | Admitting: Gastroenterology

## 2019-08-20 ENCOUNTER — Other Ambulatory Visit: Payer: Self-pay | Admitting: Gastroenterology

## 2019-08-20 ENCOUNTER — Telehealth: Payer: Self-pay

## 2019-08-20 DIAGNOSIS — K219 Gastro-esophageal reflux disease without esophagitis: Secondary | ICD-10-CM

## 2019-08-20 MED ORDER — OMEPRAZOLE 20 MG PO CPDR: 20.0000 mg | DELAYED_RELEASE_CAPSULE | Freq: Two times a day (BID) | ORAL | 3 refills | Status: DC

## 2019-08-20 NOTE — Telephone Encounter (Signed)
Noted Rx sent to pharmacy  

## 2019-08-20 NOTE — Telephone Encounter (Signed)
Refill request received from [facility] for Omeprazole 20 mg, take one capsule bid before a meal.

## 2019-08-20 NOTE — Telephone Encounter (Signed)
Spoke with pt. Pt is taking Omeprazole 20 mg.

## 2019-08-20 NOTE — Telephone Encounter (Signed)
Looks like patient is now taking Dexilant not omeprazole. Please confirm.

## 2019-08-31 ENCOUNTER — Emergency Department (HOSPITAL_COMMUNITY): Payer: Medicare Other

## 2019-08-31 ENCOUNTER — Emergency Department (HOSPITAL_COMMUNITY)
Admission: EM | Admit: 2019-08-31 | Discharge: 2019-08-31 | Disposition: A | Discharge status: Home / Self Care | Payer: Medicare Other | Attending: Emergency Medicine | Admitting: Emergency Medicine

## 2019-08-31 ENCOUNTER — Encounter (HOSPITAL_COMMUNITY): Payer: Self-pay | Admitting: *Deleted

## 2019-08-31 ENCOUNTER — Other Ambulatory Visit: Payer: Self-pay

## 2019-08-31 DIAGNOSIS — Z87891 Personal history of nicotine dependence: Secondary | ICD-10-CM | POA: Insufficient documentation

## 2019-08-31 DIAGNOSIS — I10 Essential (primary) hypertension: Secondary | ICD-10-CM | POA: Insufficient documentation

## 2019-08-31 DIAGNOSIS — R1013 Epigastric pain: Secondary | ICD-10-CM | POA: Diagnosis not present

## 2019-08-31 DIAGNOSIS — K219 Gastro-esophageal reflux disease without esophagitis: Secondary | ICD-10-CM | POA: Insufficient documentation

## 2019-08-31 LAB — COMPREHENSIVE METABOLIC PANEL
ALT: 20 U/L (ref 0–44)
AST: 17 U/L (ref 15–41)
Albumin: 4.1 g/dL (ref 3.5–5.0)
Alkaline Phosphatase: 86 U/L (ref 38–126)
Anion gap: 9 (ref 5–15)
BUN: 13 mg/dL (ref 8–23)
CO2: 21 mmol/L — ABNORMAL LOW (ref 22–32)
Calcium: 8.8 mg/dL — ABNORMAL LOW (ref 8.9–10.3)
Chloride: 101 mmol/L (ref 98–111)
Creatinine, Ser: 0.77 mg/dL (ref 0.61–1.24)
GFR calc Af Amer: >60 mL/min (ref >60)
GFR calc non Af Amer: >60 mL/min (ref >60)
Glucose, Bld: 103 mg/dL — ABNORMAL HIGH (ref 70–99)
Potassium: 3.9 mmol/L (ref 3.5–5.1)
Sodium: 131 mmol/L — ABNORMAL LOW (ref 135–145)
Total Bilirubin: 1 mg/dL (ref 0.3–1.2)
Total Protein: 7 g/dL (ref 6.5–8.1)

## 2019-08-31 LAB — TROPONIN I (HIGH SENSITIVITY)
Troponin I (High Sensitivity): 2 ng/L (ref <18)
Troponin I (High Sensitivity): 3 ng/L (ref <18)

## 2019-08-31 LAB — CBC WITH DIFFERENTIAL/PLATELET
Abs Immature Granulocytes: 0.02 10*3/uL (ref 0.00–0.07)
Basophils Absolute: 0 10*3/uL (ref 0.0–0.1)
Basophils Relative: 0 %
Eosinophils Absolute: 0 10*3/uL (ref 0.0–0.5)
Eosinophils Relative: 0 %
HCT: 42.9 % (ref 39.0–52.0)
Hemoglobin: 15.3 g/dL (ref 13.0–17.0)
Immature Granulocytes: 0 %
Lymphocytes Relative: 21 %
Lymphs Abs: 1.6 10*3/uL (ref 0.7–4.0)
MCH: 30.7 pg (ref 26.0–34.0)
MCHC: 35.7 g/dL (ref 30.0–36.0)
MCV: 86 fL (ref 80.0–100.0)
Monocytes Absolute: 0.7 10*3/uL (ref 0.1–1.0)
Monocytes Relative: 9 %
Neutro Abs: 5.3 10*3/uL (ref 1.7–7.7)
Neutrophils Relative %: 70 %
Platelets: 248 10*3/uL (ref 150–400)
RBC: 4.99 MIL/uL (ref 4.22–5.81)
RDW: 12.1 % (ref 11.5–15.5)
WBC: 7.6 10*3/uL (ref 4.0–10.5)
nRBC: 0 % (ref 0.0–0.2)

## 2019-08-31 LAB — LIPASE, BLOOD: Lipase: 28 U/L (ref 11–51)

## 2019-08-31 MED ORDER — ONDANSETRON HCL 4 MG/2ML IJ SOLN
4.0000 mg | Freq: Once | INTRAMUSCULAR | Status: AC
  Administered 2019-08-31: 4 mg via INTRAVENOUS
  Filled 2019-08-31: qty 2

## 2019-08-31 MED ORDER — DICYCLOMINE HCL 20 MG PO TABS
20.0000 mg | ORAL_TABLET | Freq: Three times a day (TID) | ORAL | 0 refills | Status: DC | PRN
Start: 2019-08-31 — End: 2020-04-04

## 2019-08-31 MED ORDER — MORPHINE SULFATE (PF) 4 MG/ML IV SOLN
4.0000 mg | Freq: Once | INTRAVENOUS | Status: AC
  Administered 2019-08-31: 4 mg via INTRAVENOUS
  Filled 2019-08-31: qty 1

## 2019-08-31 MED ORDER — IOHEXOL 300 MG/ML  SOLN
100.0000 mL | Freq: Once | INTRAMUSCULAR | Status: AC | PRN
  Administered 2019-08-31: 100 mL via INTRAVENOUS

## 2019-08-31 MED ORDER — PANTOPRAZOLE SODIUM 40 MG PO TBEC
40.0000 mg | DELAYED_RELEASE_TABLET | Freq: Every day | ORAL | 0 refills | Status: DC
Start: 2019-08-31 — End: 2019-11-20

## 2019-08-31 MED ORDER — PANTOPRAZOLE SODIUM 40 MG IV SOLR
40.0000 mg | Freq: Once | INTRAVENOUS | Status: AC
  Administered 2019-08-31: 40 mg via INTRAVENOUS
  Filled 2019-08-31: qty 40
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
## 2019-08-31 NOTE — ED Triage Notes (Signed)
Patient presents to the ED with upper abdominal pain for several weeks.  Patient is reported to have lack of appetite, nausea.

## 2019-08-31 NOTE — ED Notes (Signed)
Was able to contact his ride.  Ms Collier Salina is on her way to get him.

## 2019-08-31 NOTE — Discharge Instructions (Signed)
Hoy te vieron en la sala de emergencias con dolor abdominal. Te estoy refiriendo a un nuevo gastroenterlogo porque el que sola ver se ha ido de la ciudad. He llamado a 2 medicamentos nuevos para comenzar a tomarlos segn las indicaciones. Establezca tambin la atencin con un mdico de atencin primaria. Si an no tiene uno, lo he incluido en el papeleo aqu. Regrese al departamento de emergencias ante cualquier sntoma nuevo o que empeore repentinamente.

## 2019-09-26 ENCOUNTER — Ambulatory Visit: Payer: Medicare Other | Admitting: Gastroenterology

## 2019-11-20 ENCOUNTER — Other Ambulatory Visit: Payer: Self-pay

## 2019-11-20 ENCOUNTER — Encounter: Payer: Self-pay | Admitting: *Deleted

## 2019-11-20 ENCOUNTER — Ambulatory Visit (INDEPENDENT_AMBULATORY_CARE_PROVIDER_SITE_OTHER): Payer: Medicare Other | Admitting: Gastroenterology

## 2019-11-20 ENCOUNTER — Encounter: Payer: Self-pay | Admitting: Gastroenterology

## 2019-11-20 VITALS — BP 139/85 | HR 68 | Temp 97.3°F | Ht 67.0 in | Wt 193.2 lb

## 2019-11-20 DIAGNOSIS — R1013 Epigastric pain: Secondary | ICD-10-CM

## 2019-11-20 DIAGNOSIS — K219 Gastro-esophageal reflux disease without esophagitis: Secondary | ICD-10-CM

## 2019-11-20 MED ORDER — PANTOPRAZOLE SODIUM 40 MG PO TBEC: 40.0000 mg | DELAYED_RELEASE_TABLET | Freq: Two times a day (BID) | ORAL | 5 refills | Status: DC

## 2019-11-20 NOTE — Patient Instructions (Signed)
Increase pantoprazole to 40mg  twice a day (before breakfast and before evening meal).  Please have your labs and ultrasound done. We will call with results as available.  Return to the office in 6 weeks.      Aumente el pantoprazol a 40 mg dos veces al da (antes del desayuno y antes de la cena).  Hgase las pruebas de laboratorio y . Lo llamaremos con los resultados disponibles.  Regrese a Investment banker, operational.      Enfermedad de reflujo gastroesofgico en los adultos Gastroesophageal Reflux Disease, Adult El reflujo gastroesofgico (RGE) ocurre cuando el cido del estmago sube por el tubo que conecta la boca con el estmago (esfago). Normalmente, la comida baja por el esfago y se mantiene en el Music therapist, donde se la digiere. Sin embargo, cuando una persona tiene [place], los alimentos y el cido estomacal suelen volver al esfago. Si esto se vuelve un problema ms grave, a la persona se le puede diagnosticar una enfermedad llamada enfermedad de reflujo gastroesofgico (ERGE). La ERGE ocurre cuando el reflujo:  Sucede a menudo.  Causa sntomas frecuentes o graves.  Causa problemas tales como dao en el esfago. Cuando el cido del CLAMART en contacto con el esfago, el cido puede provocar dolor (inflamacin) en el esfago. Con el tiempo, pueden formarse pequeos agujeros (lceras) en el revestimiento del esfago. Cules son las causas? Esta afeccin se debe a un problema en el msculo que se encuentra entre el esfago y Proofreader (esfnter esofgico inferior, o EEI). Normalmente, el EEI se cierra una vez que la comida pasa a travs del esfago hasta el [place]. Cuando el EEI se encuentra debilitado o tiene alguna anomala, no se cierra por completo, y eso permite que tanto la comida como el jugo gstrico, que es cido, [place] a subir por el esfago. El EEI puede debilitarse a causa de ciertas sustancias alimenticias, medicamentos y [place], que  incluyen:  El consumo de [place].  [place].  Tener una hernia de hiato.  Consumo de alcohol.  Ciertos alimentos y bebidas, como caf, chocolate, cebollas y [place]. Qu incrementa el riesgo? Es ms probable que tenga esta afeccin si:  Tiene un aumento del [place].  Tiene un trastorno del tejido conjuntivo.  Runner, broadcasting/film/video antiinflamatorios no esteroideos (AINE). Cules son los signos o los sntomas? Los sntomas de esta afeccin incluyen:  Acidez estomacal.  Dificultad o dolor al tragar.  Sensacin de [place] un bulto en la garganta.  Sabor amargo en la boca.  Mal aliento.  Gran cantidad de saliva.  Estmago inflamado o con Warehouse manager.  Eructos.  Dolor en el pecho. El dolor de pecho puede deberse a distintas afecciones. Es importante que consulte al mdico si tiene dolor de [place].  Dificultad para respirar o sibilancias.  Tos constante (crnica) o tos nocturna.  Desgaste del [place].  Prdida de peso. Cmo se diagnostica? El mdico le har una historia clnica y un examen fsico. Para determinar si tiene ERGE leve o grave, el mdico tambin puede controlar cmo usted reacciona al tratamiento. Tambin pueden Engineer, structural, que [place] los siguientes:  Un estudio para examinarle el [place] y el esfago con una cmara pequea (endoscopa).  Una prueba para medir el grado de [place].  Una prueba para medir cunta presin hay en el esfago.  Un estudio de deglucin con bario comn o modificado para ver la forma, el tamao y el funcionamiento del esfago. Cmo se trata? El [place] del tratamiento es ayudar  a Paramedicaliviar los sntomas y Automotive engineerevitar las complicaciones. El tratamiento de esta afeccin puede variar segn la gravedad de los sntomas. El mdico puede recomendarle lo siguiente:  Cambios en la dieta.  Medicamentos.  Cipriano MileUna ciruga. Siga estas indicaciones en su casa: Comida y bebida   Siga la dieta recomendada por el mdico. Esto puede  incluir evitar ciertos alimentos y bebidas, por ejemplo: ? Caf y t (con o sin cafena). ? Bebidas que contengan alcohol. ? Bebidas energticas y deportivas. ? Bebidas gaseosas o refrescos. ? Chocolate y cacao. ? Menta y esencia de [place]a. ? Ajo y cebolla. ? Rbano picante. ? Alimentos condimentados, picantes y cidos, por ejemplo, todos los tipos de pimientas, [place] en polvo, curry en polvo, vinagre, salsas picantes y Occidental Petroleumsalsa barbacoa. ? Ctricos y sus jugos, por ejemplo, naranjas, limones y limas. ? Alimentos a base de [address], como salsa de Boltontomate, [place], salsa picante y pizza con salsa de Mount Vernontomate. ? Alimentos fritos y Washtucnagrasos, Valley Parkcomo donas, papas fritas y aderezos ricos en grasas. ? Carnes con alto contenido de grasa, como salchichas, y cortes de carnes rojas y blancas con mucha grasa, por ejemplo, chuletas o costillas, embutidos, jamn y tocino. ? Productos lcteos ricos en grasas, como leche Union Depositentera, Plain Viewmanteca y Ellentonqueso crema.  Haga comidas pequeas y frecuentes Freight forwarderdurante el da en lugar de comidas abundantes.  Evite beber grandes cantidades de lquidos con las comidas.  Evite comer 2 o 3horas antes de acostarse.  Evite recostarse inmediatamente despus de comer.  No haga ejercicios enseguida despus de comer. Estilo de vida   No consuma ningn producto que contenga nicotina o tabaco, como cigarrillos, cigarrillos electrnicos y tabaco de Theatre managermascar. Si necesita ayuda para dejar de fumar, consulte al American Expressmdico.  Trate de reducir el estrs con mtodos como el yoga o la meditacin. Si necesita ayuda para reducir J. C. Penneyel nivel de estrs, consulte al mdico.  Si tiene sobrepeso, baje hasta llegar a un peso saludable para usted. Pdale consejos al mdico para bajar de peso de Pottermanera segura. Indicaciones generales  Est atento a cualquier cambio en los sntomas.  Tome los medicamentos de venta libre y los recetados solamente como se lo haya indicado el mdico. No tome aspirina, ibuprofeno ni otros AINE a  menos que el mdico se lo indique.  Use ropa holgada. No use nada apretado alrededor de la cintura que haga presin sobre el abdomen.  Levante (eleve) la cabecera de la cama aproximadamente 6pulgadas (15cm).  Evite inclinarse si al hacerlo empeoran los sntomas.  Concurra a todas las visitas de [address]seguimiento como se lo haya indicado el mdico. Esto es importante. Comunquese con un mdico si:  Tiene los siguientes sntomas: ? Sntomas nuevos. ? Prdida de peso sin causa aparente. ? Dificultad o dolor al tragar. ? Sibilancias o una tos persistente. ? Voz ronca.  Los sntomas no mejoran con Scientist, research (medical)el tratamiento. Solicite ayuda inmediatamente si:  Goldman SachsSiente dolor en los brazos, el cuello, la Gonzalesmandbula, los dientes o la espalda.  Se siente transpirado, mareado o tiene una sensacin de desvanecimiento.  Siente dolor intenso en el pecho o le falta el aire.  Vomita y el vmito tiene un aspecto similar a la sangre o a los posos de caf.  Se desmaya.  Tiene heces sanguinolentas o negras.  No puede tragar, beber o comer. Resumen  El reflujo gastroesofgico ocurre cuando el cido del estmago sube al esfago. La ERGE es una enfermedad en la que el reflujo ocurre con frecuencia, causa sntomas frecuentes o graves, o causa  problemas tales como Photographer.  El tratamiento de esta afeccin puede variar segn la gravedad de los sntomas. El mdico puede indicarle que siga una dieta y haga cambios en su estilo de vida, tome medicamentos o se someta a [place].  Comunquese con un mdico si tiene sntomas nuevos o los sntomas empeoran.  Tome los medicamentos de venta libre y los recetados solamente como se lo haya indicado el mdico. No tome aspirina, ibuprofeno ni otros AINE a menos que el mdico se lo indique.  Concurra a todas las visitas de [address] se lo haya indicado el mdico. Esto es importante. Esta informacin no tiene Theme park manager el consejo del mdico.  Asegrese de hacerle al mdico cualquier pregunta que tenga. Document Revised: 09/01/2017 Document Reviewed: 09/01/2017 Elsevier Patient Education  2020 Elsevier Inc.     Opciones de alimentos para pacientes adultos con enfermedad de reflujo gastroesofgico Food Choices for Gastroesophageal Reflux Disease, Adult Si tiene enfermedad de reflujo gastroesofgico (ERGE), los alimentos que consume y los hbitos de alimentacin son Engineer, production. Elegir los alimentos adecuados puede ayudar a [employer]. Piense en consultar a un especialista en nutricin (nutricionista) para que lo ayude a Associate Professor. Consejos para seguir [employer] plan  Comidas  Elija alimentos saludables con bajo contenido de grasa, como frutas, verduras, cereales integrales, productos lcteos descremados y carne [place] de [place], de pescado y de [place].  Haga comidas pequeas durante Glass blower/designer de 3 comidas abundantes. Coma lentamente y en un lugar donde est distendido. Evite agacharse o recostarse hasta 2 o 3horas despus de haber comido.  Evite comer 2 a 3horas antes de ir a acostarse.  Evite beber grandes cantidades de lquidos con las comidas.  Evite frer los alimentos a la hora de la coccin. Puede hornear, grillar o asar a la parrilla.  Evite o limite la cantidad de: ? Chocolate. ? Menta y mentol. ? Alcohol. ? Pimienta. ? Caf negro y descafeinado. ? T negro y descafeinado. ? Bebidas con gas (gaseosas). ? Bebidas energizantes y refrescos que contengan cafena.  Limite los alimentos con alto contenido de [place], por ejemplo: ? Carnes grasas o alimentos fritos. ? Leche entera, crema, manteca o helado. ? Nueces y [place] de frutos secos. ? Pastelera, donas y dulces hechos con [place] o [place].  Evite los alimentos que le ocasionen sntomas. Estos pueden ser distintos para Advertising account planner. Los alimentos que suelen causan sntomas son los siguientes: ? Haematologist. ? Naranjas, limones  y limas. ? Pimientos. ? Comidas condimentadas. ? Cebolla y Kae Heller. ? Vinagre. Estilo de vida  Mantenga un peso saludable. Pregntele a su mdico cul es el peso saludable para usted. Si necesita perder peso, hable con su mdico para hacerlo de manera segura.  Realice actividad fsica durante, al menos, 30 minutos 5 das por semana o ms, o segn lo indicado por su mdico.  Use ropa suelta.  No fume. Si necesita ayuda para dejar de fumar, consulte al mdico.  Duerma con la cabecera de la cama ms elevada que los pies. Use una cua debajo del colchn o bloques debajo del armazn de la cama para Pharmacologist la cabecera de la cama elevada. Resumen  Si tiene enfermedad de reflujo gastroesofgico (ERGE), las elecciones de alimentos y el [place] de vida son muy importantes para ayudar a Paramedic los sntomas.  Haga comidas pequeas durante Glass blower/designer de 3 comidas abundantes. Coma lentamente y en un lugar donde est distendido.  Limite  los alimentos con alto contenido graso como la carne grasa o los alimentos fritos.  Evite agacharse o recostarse hasta 2 o 3horas despus de haber comido.  Evite la menta y [place] buena, la cafena, el alcohol y el chocolate. Esta informacin no tiene Theme park manager el consejo del mdico. Asegrese de hacerle al mdico cualquier pregunta que tenga. Document Revised: 08/24/2016 Document Reviewed: 08/24/2016 Elsevier Patient Education  2020 Arvin[employer]itor.

## 2019-11-20 NOTE — Progress Notes (Signed)
Primary Care Physician: Patient, No Pcp Per  Primary Gastroenterologist:  Roetta Sessions, MD   Chief Complaint  Patient presents with  . Abdominal Pain    soem better, but still has epigastric pain that comes/goes    HPI: Jim Collins is a 69 y.o. male here for further evaluation of epigastric pain. We utilized formal Teacher, English as a foreign language by phone.   Patient was seen January 2021 for upper abdominal pain/burning, GERD.  Reported being on Protonix for 10 years at that time.  Because of difficult to manage symptoms, he did complete an upper endoscopy in February that showed small hiatal hernia only.Since he was initially seen he has been on omeprazole and Dexilant. Today he says he is back on pantoprazole. We will check with pharmacy.   Had been doing okay but couple of months ago started having epigastric pain again. No heartburn. No n/v. Pain every day but comes and goes and sometimes related to meals. No dysphagia. BM ok. No melena, brbpr. Weight up 15 pounds this year. He denies etoh in 5 years, although records from 07/2019 ED reports that he had started drinking again.    Seen in the ED back in July for epigastric pain. He has had two CTs back in June and July.  June CT with mild dilated small bowel loops ?mild ileus vs enteritis. CT in July, no acute findings and small bowel normal. Labs as outlined below. He was given rx for bentyl and advised to follow up with [place]. Patient has had couple of ED evaluations in May and June for involuntary commitment.    Wt Readings from Last 3 Encounters:  11/20/19 193 lb 3.2 oz (87.6 kg)  08/31/19 185 lb (83.9 kg)  07/03/19 180 lb (81.6 kg)     Current Outpatient Medications  Medication Sig Dispense Refill  . atorvastatin (LIPITOR) 20 MG tablet Take 20 mg by mouth daily at 6 PM.     . dicyclomine (BENTYL) 20 MG tablet Take 1 tablet (20 mg total) by mouth 3 (three) times daily as needed for spasms. 20 tablet 0  . diltiazem (DILTIAZEM CD) 180  MG 24 hr capsule Take 180 mg by mouth daily.     Marland Kitchen escitalopram (LEXAPRO) 10 MG tablet Take 10 mg by mouth daily.    . pantoprazole (PROTONIX) 40 MG tablet Take 1 tablet (40 mg total) by mouth daily. 30 tablet 0  . QUEtiapine (SEROQUEL) 25 MG tablet Take 25 mg by mouth 2 (two) times daily.     No current facility-administered medications for this visit.    Allergies as of 11/20/2019 - Review Complete 11/20/2019  Allergen Reaction Noted  . Haloperidol lactate  10/16/2009  . Compazine [prochlorperazine] Anxiety 05/10/2016  . Thorazine [chlorpromazine] Anxiety 05/10/2016    ROS:  General: Negative for anorexia, weight loss, fever, chills, fatigue, weakness. ENT: Negative for hoarseness, difficulty swallowing , nasal congestion. CV: Negative for chest pain, angina, palpitations, dyspnea on exertion, peripheral edema.  Respiratory: Negative for dyspnea at rest, dyspnea on exertion, cough, sputum, wheezing.  GI: See history of present illness. GU:  Negative for dysuria, hematuria, urinary incontinence, urinary frequency, nocturnal urination.  Endo: Negative for unusual weight change.    Physical Examination:   BP 139/85   Pulse 68   Temp (!) 97.3 F (36.3 C)   Ht 5\' 7"  (1.702 m)   Wt 193 lb 3.2 oz (87.6 kg)   BMI 30.26 kg/m   General: Well-nourished, well-developed in no acute  distress.  Eyes: No icterus. Mouth: masked Lungs: Clear to auscultation bilaterally.  Heart: Regular rate and rhythm, no murmurs rubs or gallops.  Abdomen: Bowel sounds are normal, nontender, nondistended, no hepatosplenomegaly or masses, no abdominal bruits or hernia , no rebound or guarding.   Extremities: 1+ lower extremity edema at ankles. No clubbing or deformities. Neuro: Alert and oriented x 4   Skin: Warm and dry, no jaundice.   Psych: Alert and cooperative, normal mood and affect.  Labs:  Lab Results  Component Value Date   CREATININE 0.77 08/31/2019   BUN 13 08/31/2019   NA 131 (L)  08/31/2019   K 3.9 08/31/2019   CL 101 08/31/2019   CO2 21 (L) 08/31/2019   Lab Results  Component Value Date   ALT 20 08/31/2019   AST 17 08/31/2019   ALKPHOS 86 08/31/2019   BILITOT 1.0 08/31/2019   Lab Results  Component Value Date   WBC 7.6 08/31/2019   HGB 15.3 08/31/2019   HCT 42.9 08/31/2019   MCV 86.0 08/31/2019   PLT 248 08/31/2019   Lab Results  Component Value Date   LIPASE 28 08/31/2019     Imaging Studies: No results found.   Impression/Plan:  69 y/o Hispanic speaking male presenting for further evaluation of frequent intermittent epigastric pain and GERD. Typical GERD symptoms better controlled. He reports he is taking pantoprazole 40mg  daily but this is unclear by our records. We will contact pharmacy to verify what PPI he has been on lately.   Patient reports daily intermittent epigastric pain sometimes worse with meals. He denies ongoing etoh use. No n/v. No other symptoms. He is able to eat. He has gained 15 pounds. DDx includes refractory GERD, dyspepsia, biliary etiology. EGD earlier this year reassuring. CTs unremarkable.   Will increase his pantoprazole to 40mg  bid. Update labs and ruq u/s of gallbladder. Reinforced antireflux measures. Handouts provided in spanish. Return to the office in six weeks.

## 2019-11-20 NOTE — Progress Notes (Signed)
No pcp per patient 

## 2019-11-27 ENCOUNTER — Ambulatory Visit (HOSPITAL_COMMUNITY): Payer: Medicare Other

## 2019-12-18 ENCOUNTER — Other Ambulatory Visit: Payer: Self-pay

## 2019-12-18 ENCOUNTER — Ambulatory Visit (HOSPITAL_COMMUNITY)
Admission: RE | Admit: 2019-12-18 | Discharge: 2019-12-18 | Disposition: A | Discharge status: Home / Self Care | Payer: Medicare Other | Source: Ambulatory Visit | Attending: Gastroenterology | Admitting: Gastroenterology

## 2019-12-18 DIAGNOSIS — R1013 Epigastric pain: Secondary | ICD-10-CM | POA: Insufficient documentation

## 2019-12-18 DIAGNOSIS — K219 Gastro-esophageal reflux disease without esophagitis: Secondary | ICD-10-CM | POA: Diagnosis present

## 2019-12-25 ENCOUNTER — Encounter (HOSPITAL_COMMUNITY): Payer: Self-pay

## 2019-12-25 ENCOUNTER — Other Ambulatory Visit: Payer: Self-pay

## 2019-12-25 ENCOUNTER — Emergency Department (HOSPITAL_COMMUNITY)
Admission: EM | Admit: 2019-12-25 | Discharge: 2019-12-25 | Disposition: A | Discharge status: Home / Self Care | Payer: Medicare Other | Attending: Emergency Medicine | Admitting: Emergency Medicine

## 2019-12-25 ENCOUNTER — Emergency Department (HOSPITAL_COMMUNITY): Payer: Medicare Other

## 2019-12-25 DIAGNOSIS — Z87891 Personal history of nicotine dependence: Secondary | ICD-10-CM | POA: Diagnosis not present

## 2019-12-25 DIAGNOSIS — R112 Nausea with vomiting, unspecified: Secondary | ICD-10-CM | POA: Insufficient documentation

## 2019-12-25 DIAGNOSIS — R110 Nausea: Secondary | ICD-10-CM

## 2019-12-25 DIAGNOSIS — R109 Unspecified abdominal pain: Secondary | ICD-10-CM | POA: Diagnosis not present

## 2019-12-25 DIAGNOSIS — I10 Essential (primary) hypertension: Secondary | ICD-10-CM | POA: Insufficient documentation

## 2019-12-25 DIAGNOSIS — K5900 Constipation, unspecified: Secondary | ICD-10-CM

## 2019-12-25 LAB — COMPREHENSIVE METABOLIC PANEL
ALT: 18 U/L (ref 0–44)
AST: 24 U/L (ref 15–41)
Albumin: 4 g/dL (ref 3.5–5.0)
Alkaline Phosphatase: 81 U/L (ref 38–126)
Anion gap: 8 (ref 5–15)
BUN: 14 mg/dL (ref 8–23)
CO2: 25 mmol/L (ref 22–32)
Calcium: 8.8 mg/dL — ABNORMAL LOW (ref 8.9–10.3)
Chloride: 99 mmol/L (ref 98–111)
Creatinine, Ser: 0.8 mg/dL (ref 0.61–1.24)
GFR, Estimated: >60 mL/min (ref >60)
Glucose, Bld: 125 mg/dL — ABNORMAL HIGH (ref 70–99)
Potassium: 4.3 mmol/L (ref 3.5–5.1)
Sodium: 132 mmol/L — ABNORMAL LOW (ref 135–145)
Total Bilirubin: 0.5 mg/dL (ref 0.3–1.2)
Total Protein: 7.3 g/dL (ref 6.5–8.1)

## 2019-12-25 LAB — URINALYSIS, ROUTINE W REFLEX MICROSCOPIC
Bilirubin Urine: NEGATIVE
Glucose, UA: NEGATIVE mg/dL
Hgb urine dipstick: NEGATIVE
Ketones, ur: NEGATIVE mg/dL
Leukocytes,Ua: NEGATIVE
Nitrite: NEGATIVE
Protein, ur: NEGATIVE mg/dL
Specific Gravity, Urine: 1.012 (ref 1.005–1.030)
pH: 7 (ref 5.0–8.0)

## 2019-12-25 LAB — CBC
HCT: 40.6 % (ref 39.0–52.0)
Hemoglobin: 13.8 g/dL (ref 13.0–17.0)
MCH: 30.5 pg (ref 26.0–34.0)
MCHC: 34 g/dL (ref 30.0–36.0)
MCV: 89.6 fL (ref 80.0–100.0)
Platelets: 211 10*3/uL (ref 150–400)
RBC: 4.53 MIL/uL (ref 4.22–5.81)
RDW: 12.5 % (ref 11.5–15.5)
WBC: 11.4 10*3/uL — ABNORMAL HIGH (ref 4.0–10.5)
nRBC: 0 % (ref 0.0–0.2)

## 2019-12-25 LAB — LIPASE, BLOOD: Lipase: 17 U/L (ref 11–51)

## 2019-12-25 MED ORDER — ONDANSETRON HCL 4 MG/2ML IJ SOLN
INTRAMUSCULAR | Status: AC
  Filled 2019-12-25: qty 2

## 2019-12-25 MED ORDER — ONDANSETRON HCL 4 MG/2ML IJ SOLN
4.0000 mg | Freq: Once | INTRAMUSCULAR | Status: AC
  Administered 2019-12-25: 4 mg via INTRAVENOUS

## 2019-12-25 MED ORDER — POLYETHYLENE GLYCOL 3350 17 G PO PACK: 17.0000 g | PACK | Freq: Every day | ORAL | 0 refills | Status: DC

## 2019-12-25 MED ORDER — SODIUM CHLORIDE 0.9 % IV SOLN: Freq: Once | INTRAVENOUS | Status: AC

## 2019-12-25 MED ORDER — ONDANSETRON HCL 4 MG/2ML IJ SOLN
4.0000 mg | Freq: Once | INTRAMUSCULAR | Status: AC
  Administered 2019-12-25: 4 mg via INTRAVENOUS
  Filled 2019-12-25: qty 2

## 2019-12-25 MED ORDER — ONDANSETRON 4 MG PO TBDP: 4.0000 mg | ORAL_TABLET | Freq: Three times a day (TID) | ORAL | 0 refills | Status: DC | PRN

## 2019-12-25 MED ORDER — PROMETHAZINE HCL 12.5 MG PO TABS
25.0000 mg | ORAL_TABLET | Freq: Once | ORAL | Status: AC
  Administered 2019-12-25: 25 mg via ORAL
  Filled 2019-12-25: qty 2

## 2019-12-25 MED ORDER — DICYCLOMINE HCL 10 MG PO CAPS
10.0000 mg | ORAL_CAPSULE | Freq: Once | ORAL | Status: AC
  Administered 2019-12-25: 10 mg via ORAL
  Filled 2019-12-25: qty 1

## 2019-12-25 NOTE — ED Notes (Signed)
Patient to wait in lobby for ride. States he has no one to get him tonight so he will wait until in the morning but will keep calling.

## 2019-12-25 NOTE — ED Notes (Signed)
Pt stated he can walk home. Told that would not be best since it is too cold. Pt agreed stating he was too old and it was too cold. Agreed to wait in lobby.

## 2019-12-25 NOTE — ED Notes (Signed)
Unable to get in contact with family

## 2019-12-25 NOTE — ED Triage Notes (Signed)
Patient reports nausea and vomiting for the past two days with 5 episodes of emesis this am. Denies diarrhea/fever/pain.
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
## 2019-12-25 NOTE — ED Notes (Signed)
Patient transported to X-ray 

## 2019-12-25 NOTE — Discharge Instructions (Signed)
Take mira lax as directed

## 2019-12-26 ENCOUNTER — Telehealth: Payer: Self-pay | Admitting: Internal Medicine

## 2019-12-26 NOTE — Telephone Encounter (Signed)
[phone] please call patient, went to the ER for vomiting and was given medication for nausea, needs to know about taking that with what he is already prescribed here

## 2019-12-26 NOTE — Telephone Encounter (Signed)
FYI Spoke with pts friend. Pt was given Zofran at the ED visit and it's ok to take medication as prescribed at the ED for nausea.

## 2019-12-26 NOTE — Telephone Encounter (Signed)
Noted  

## 2019-12-26 NOTE — Telephone Encounter (Signed)
Reviewed ED records. Ok for zofran. Patient has follow up appt already scheduled.

## 2020-01-03 ENCOUNTER — Telehealth: Payer: Self-pay | Admitting: Internal Medicine

## 2020-01-03 NOTE — Telephone Encounter (Signed)
Pt hasn't done his labs because he lost his papers. He rescheduled his OV for tomorrow and is aware of new date and time. Wife is wanting to know his U/S results. [phone]

## 2020-01-03 NOTE — Telephone Encounter (Signed)
Spoke with pts significant other. Lab orders were printed and mailed to pt today. They are aware the orders are in the system and they don't have to wait on the orders if wanted. Discussed u/s results.

## 2020-01-04 ENCOUNTER — Ambulatory Visit: Payer: Medicare Other | Admitting: Gastroenterology

## 2020-02-15 ENCOUNTER — Telehealth: Payer: Self-pay | Admitting: Internal Medicine

## 2020-02-15 ENCOUNTER — Ambulatory Visit: Payer: Medicare Other | Admitting: Gastroenterology

## 2020-02-15 NOTE — Telephone Encounter (Signed)
Mrs Collier Salina called asking about patient's lab results. [phone]

## 2020-02-20 NOTE — Telephone Encounter (Signed)
Spoke with Ms. Flowers. Pt had his labs done at his PCP office. Ms. Collier Salina was asked to call pts PCP and have them send lab results to our office after they give pt his results.

## 2020-02-21 NOTE — Telephone Encounter (Signed)
Reviewed labs from 01/22/2020: White blood cell count 6400, hemoglobin 14.6, platelets 220,000, BUN 10, creatinine 0.71, sodium 140, potassium 4.6, albumin 4.4, total bilirubin 0.4, alk phos 98, AST 17, ALT 15.  He canceled his December OV.  He no showed OV last week.  Would offer him an office visit if he is having any ongoing GI issues/concerns.

## 2020-02-21 NOTE — Telephone Encounter (Signed)
Spoke with Ms. Flowers. She is aware that results were reviewed. Pt was scheduled a f/u for 04/2020.

## 2020-04-04 ENCOUNTER — Ambulatory Visit (INDEPENDENT_AMBULATORY_CARE_PROVIDER_SITE_OTHER): Payer: Medicare Other | Admitting: Gastroenterology

## 2020-04-04 ENCOUNTER — Encounter: Payer: Self-pay | Admitting: Gastroenterology

## 2020-04-04 ENCOUNTER — Other Ambulatory Visit: Payer: Self-pay

## 2020-04-04 VITALS — BP 156/96 | HR 71 | Temp 97.1°F | Ht 67.0 in | Wt 195.4 lb

## 2020-04-04 DIAGNOSIS — K5900 Constipation, unspecified: Secondary | ICD-10-CM

## 2020-04-04 DIAGNOSIS — K219 Gastro-esophageal reflux disease without esophagitis: Secondary | ICD-10-CM

## 2020-04-04 MED ORDER — ONDANSETRON 4 MG PO TBDP: 4.0000 mg | ORAL_TABLET | Freq: Four times a day (QID) | ORAL | 1 refills | Status: AC | PRN

## 2020-04-04 MED ORDER — POLYETHYLENE GLYCOL 3350 17 G PO PACK: PACK | ORAL | 5 refills | Status: DC

## 2020-04-04 NOTE — Patient Instructions (Addendum)
  Tome 17 gramos de Miralax (una tapa o un paquete) mezclados con 4 a 6 onzas de lquido [employer] al da [employer] movimiento intestinal sea [place], luego contine UNA VEZ al da a partir de entonces.  Llame si Medicaid no paga por Miralax.  Contine pantoprazol 40 mg dos veces al da antes de una comida para el reflujo.  receta para medicamentos para las nuseas (odansetrn) enviada a su farmacia para usar cada 6 horas segn sea necesario.  Regrese a la oficina en 4 semanas para seguimiento.

## 2020-04-04 NOTE — Progress Notes (Signed)
Primary Care Physician: Patient, No Pcp Per  Primary Gastroenterologist:  Roetta Sessions, MD   Chief Complaint  Patient presents with  . change in bowels    Can go 2-3 days without BM, sometimes he then will have to go often  . Abdominal Pain    When he doesn't have a BM 2-3 days he will have pain    HPI: Jim Collins is a 70 y.o. male here for follow-up.  Last seen October 2021.  Utilized formal Spanish interpreter today.  When gets nervous has abdominal pain. Sometimes has some nausea. Carafate helps a little bit. Oxycodone tid back pain. Has been on pain medication for 10 years. Feels like constipation has gotten worse over the years. If he doesn't have a BM for several days, he will have abdominal pain. He denies melena, brbpr. He has tried Miralax in the past and it helped. Currently having a BM about every 2-4 days. Denies heartburn. Continues to gain weight, up 15 pounds in the past 9 months, total of 50 pounds since 2018.   Last colonoscopy about 15 years ago in [place]. Per patient it was normal.    Current Outpatient Medications  Medication Sig Dispense Refill  . pantoprazole (PROTONIX) 40 MG tablet Take 40 mg by mouth 2 (two) times daily before a meal.    . sucralfate (CARAFATE) 1 g tablet Take 1 g by mouth in the morning, at noon, and at bedtime. As needed    . losartan (COZAAR) 50 MG tablet Take 50 mg by mouth daily.    Marland Kitchen OLANZapine (ZYPREXA) 15 MG tablet Take 15 mg by mouth at bedtime.    . Oxycodone HCl 10 MG TABS Take 10 mg by mouth 3 (three) times daily as needed.     No current facility-administered medications for this visit.    Allergies as of 04/04/2020 - Review Complete 04/04/2020  Allergen Reaction Noted  . Haloperidol lactate  10/16/2009  . Morphine and related  11/20/2019  . Compazine [prochlorperazine] Anxiety 05/10/2016  . Thorazine [chlorpromazine] Anxiety 05/10/2016    ROS:  General: Negative for anorexia, weight loss, fever, chills,  fatigue, weakness. ENT: Negative for hoarseness, difficulty swallowing , nasal congestion. CV: Negative for chest pain, angina, palpitations, dyspnea on exertion, peripheral edema.  Respiratory: Negative for dyspnea at rest, dyspnea on exertion, cough, sputum, wheezing.  GI: See history of present illness. GU:  Negative for dysuria, hematuria, urinary incontinence, urinary frequency, nocturnal urination.  Endo: Negative for unusual weight change.    Physical Examination:   BP (!) 156/96   Pulse 71   Temp (!) 97.1 F (36.2 C)   Ht 5\' 7"  (1.702 m)   Wt 195 lb 6.4 oz (88.6 kg)   BMI 30.60 kg/m   General: Well-nourished, well-developed in no acute distress.  Eyes: No icterus. Mouth: masked. Lungs: Clear to auscultation bilaterally.  Heart: Regular rate and rhythm, no murmurs rubs or gallops.  Abdomen: Bowel sounds are normal, nontender, nondistended, no hepatosplenomegaly or masses, no abdominal bruits or hernia , no rebound or guarding.   Extremities: No lower extremity edema. No clubbing or deformities. Neuro: Alert and oriented x 4   Skin: Warm and dry, no jaundice.   Psych: Alert and cooperative, normal mood and affect.  Labs:  Lab Results  Component Value Date   CREATININE 0.80 12/25/2019   BUN 14 12/25/2019   NA 132 (L) 12/25/2019   K 4.3 12/25/2019   CL 99 12/25/2019  CO2 25 12/25/2019   Lab Results  Component Value Date   WBC 11.4 (H) 12/25/2019   HGB 13.8 12/25/2019   HCT 40.6 12/25/2019   MCV 89.6 12/25/2019   PLT 211 12/25/2019   Lab Results  Component Value Date   ALT 18 12/25/2019   AST 24 12/25/2019   ALKPHOS 81 12/25/2019   BILITOT 0.5 12/25/2019     Imaging Studies: No results found.  Assessment:  70 y/o Hispanic speaking male presenting for follow up abdominal pain, chronic GERD,  and constipation. Previously evaluated by CT A/P, RUQ u/s, EGD in 2021.   Genella Rife currently well controlled. Has some intermittent nausea for which he takes Zofran  ODT.   Abdominal pain generally associated with constipation. Likely exacerbated by chronic narcotic use. Last colonoscopy 15 years ago.   Plan:  1. Miralax 17 grams twice daily until soft BM, then once daily. 2. Continue pantoprazole 40mg  BID before meal.  3. Zofran ODT every 6 hours as needed for nausea.  4. Return to office in four weeks, we will make arrangements for a colonoscopy at that time once we make sure your constipation is adequately addressed.

## 2020-04-06 ENCOUNTER — Encounter: Payer: Self-pay | Admitting: Gastroenterology

## 2020-04-07 NOTE — Progress Notes (Signed)
NO PCP PER PATIENT °

## 2020-05-14 ENCOUNTER — Ambulatory Visit (INDEPENDENT_AMBULATORY_CARE_PROVIDER_SITE_OTHER): Payer: Medicare Other | Admitting: Gastroenterology

## 2020-05-14 ENCOUNTER — Encounter: Payer: Self-pay | Admitting: Gastroenterology

## 2020-05-14 ENCOUNTER — Other Ambulatory Visit: Payer: Self-pay

## 2020-05-14 VITALS — BP 170/100 | HR 88 | Temp 97.3°F | Ht 67.0 in | Wt 196.8 lb

## 2020-05-14 DIAGNOSIS — K219 Gastro-esophageal reflux disease without esophagitis: Secondary | ICD-10-CM | POA: Diagnosis not present

## 2020-05-14 DIAGNOSIS — K5900 Constipation, unspecified: Secondary | ICD-10-CM

## 2020-05-14 NOTE — Patient Instructions (Addendum)
1. Continue MiraLAX 17g once or twice daily to manage constipation. 2. Continue pantoprazole 40 mg once or twice daily for acid reflux. 3. Colonoscopy as scheduled.  Please see separate instructions.  1. Contine con MiraLAX 17g una o dos veces al da para Estate manager/land agent. 2. Contine pantoprazol 40 mg una o dos veces al da para el reflujo cido. 3. Colonoscopia segn lo programado. Consulte las instrucciones por separado.
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
## 2020-05-19 ENCOUNTER — Telehealth: Payer: Self-pay

## 2020-05-19 ENCOUNTER — Other Ambulatory Visit: Payer: Self-pay

## 2020-05-19 MED ORDER — CLENPIQ 10-3.5-12 MG-GM -GM/160ML PO SOLN: 1.0000 | Freq: Once | ORAL | 0 refills | Status: AC

## 2020-05-19 NOTE — Telephone Encounter (Signed)
Called pt via [employer]. Interpreter: Konrad Dolores, ID# [phone]. TCS w/Propofol ASA 2 with Dr. Jena Gauss scheduled for 07/03/20 at 2:00pm. Rx for prep sent to pharmacy. Orders entered. Appt letter mailed with procedure instructions.

## 2020-07-01 ENCOUNTER — Other Ambulatory Visit: Payer: Self-pay

## 2020-07-01 ENCOUNTER — Other Ambulatory Visit (HOSPITAL_COMMUNITY)
Admission: RE | Admit: 2020-07-01 | Discharge: 2020-07-01 | Disposition: A | Discharge status: Home / Self Care | Payer: Medicare Other | Source: Ambulatory Visit | Attending: Internal Medicine | Admitting: Internal Medicine

## 2020-07-02 ENCOUNTER — Telehealth: Payer: Self-pay | Admitting: *Deleted

## 2020-07-02 NOTE — Telephone Encounter (Signed)
Received call from [place] with pre-service center. Patient has [facility] Medicare effective today. TCS needs PA  PA done via [facility]. They have patient DOB listed as August 04, 1950. PA approved auth# [id number] Requested Dates: Jul 02, 2020 - Sep 30, 2020

## 2020-07-03 ENCOUNTER — Ambulatory Visit (HOSPITAL_COMMUNITY)
Admission: RE | Admit: 2020-07-03 | Discharge: 2020-07-03 | Disposition: A | Discharge status: Home / Self Care | Payer: Medicare Other | Attending: Internal Medicine | Admitting: Internal Medicine

## 2020-07-03 ENCOUNTER — Encounter (HOSPITAL_COMMUNITY)
Admission: RE | Disposition: A | Discharge status: Home / Self Care | Payer: Self-pay | Source: Home / Self Care | Attending: Internal Medicine

## 2020-07-03 ENCOUNTER — Other Ambulatory Visit: Payer: Self-pay

## 2020-07-03 ENCOUNTER — Ambulatory Visit (HOSPITAL_COMMUNITY): Payer: Medicare Other | Admitting: Certified Registered"

## 2020-07-03 ENCOUNTER — Encounter (HOSPITAL_COMMUNITY): Payer: Self-pay | Admitting: Internal Medicine

## 2020-07-03 DIAGNOSIS — Z885 Allergy status to narcotic agent status: Secondary | ICD-10-CM | POA: Insufficient documentation

## 2020-07-03 DIAGNOSIS — Z79899 Other long term (current) drug therapy: Secondary | ICD-10-CM | POA: Diagnosis not present

## 2020-07-03 DIAGNOSIS — Z1211 Encounter for screening for malignant neoplasm of colon: Secondary | ICD-10-CM | POA: Insufficient documentation

## 2020-07-03 DIAGNOSIS — Z87891 Personal history of nicotine dependence: Secondary | ICD-10-CM | POA: Diagnosis not present

## 2020-07-03 DIAGNOSIS — Z8249 Family history of ischemic heart disease and other diseases of the circulatory system: Secondary | ICD-10-CM | POA: Insufficient documentation

## 2020-07-03 DIAGNOSIS — K219 Gastro-esophageal reflux disease without esophagitis: Secondary | ICD-10-CM | POA: Diagnosis not present

## 2020-07-03 HISTORY — DX: Other complications of anesthesia, initial encounter: T88.59XA

## 2020-07-03 HISTORY — DX: Other specified postprocedural states: R11.2

## 2020-07-03 HISTORY — PX: COLONOSCOPY WITH PROPOFOL: SHX5780

## 2020-07-03 HISTORY — DX: Other specified postprocedural states: Z98.890

## 2020-07-03 SURGERY — COLONOSCOPY WITH PROPOFOL
Anesthesia: General

## 2020-07-03 MED ORDER — LACTATED RINGERS IV SOLN: INTRAVENOUS | Status: DC | PRN

## 2020-07-03 MED ORDER — LIDOCAINE HCL (CARDIAC) PF 50 MG/5ML IV SOSY
PREFILLED_SYRINGE | INTRAVENOUS | Status: DC | PRN
  Administered 2020-07-03: 40 mg via INTRAVENOUS

## 2020-07-03 MED ORDER — PROPOFOL 10 MG/ML IV BOLUS
INTRAVENOUS | Status: DC | PRN
  Administered 2020-07-03: 125 ug/kg/min via INTRAVENOUS
  Administered 2020-07-03: 80 mg via INTRAVENOUS
  Administered 2020-07-03: 50 mg via INTRAVENOUS

## 2020-07-03 MED ORDER — STERILE WATER FOR IRRIGATION IR SOLN
Status: DC | PRN
  Administered 2020-07-03: 1.5 mL

## 2020-07-03 NOTE — Transfer of Care (Signed)
Immediate Anesthesia Transfer of Care Note  Patient: Jim Collins  Procedure(s) Performed: COLONOSCOPY WITH PROPOFOL (N/A )  Patient Location: Endoscopy Unit  Anesthesia Type:General  Level of Consciousness: awake, alert , oriented and patient cooperative  Airway & Oxygen Therapy: Patient Spontanous Breathing  Post-op Assessment: Report given to RN, Post -op Vital signs reviewed and stable and Patient moving all extremities  Post vital signs: Reviewed and stable  Last Vitals:  Vitals Value Taken Time  BP    Temp    Pulse    Resp    SpO2      Last Pain:  Vitals:   07/03/20 1413  TempSrc:   PainSc: 0-No pain         Complications: No complications documented.

## 2020-07-03 NOTE — Op Note (Signed)
Jim Collins Patient Name: Jim Collins Procedure Date: 07/03/2020 1:58 PM MRN: [id number] Date of Birth: [birth year] Attending MD: Gennette Pac , MD CSN: [id number] Age: 70 Admit Type: Outpatient Procedure:                Colonoscopy Indications:              Screening for colorectal malignant neoplasm Providers:                Gennette Pac, MD, Angelica Ran, Dyann Ruddle Referring MD:              Medicines:                Propofol per Anesthesia Complications:            No immediate complications. Estimated Blood Loss:     Estimated blood loss: none. Procedure:                Pre-Anesthesia Assessment:                           - Prior to the procedure, a History and Physical                            was performed, and patient medications and                            allergies were reviewed. The patient's tolerance of                            previous anesthesia was also reviewed. The risks                            and benefits of the procedure and the sedation                            options and risks were discussed with the patient.                            All questions were answered, and informed consent                            was obtained. Prior Anticoagulants: The patient has                            taken no previous anticoagulant or antiplatelet                            agents. ASA Grade Assessment: III - A patient with                            severe systemic disease. After reviewing the risks                            and benefits, the patient was deemed in  satisfactory condition to undergo the procedure.                           After obtaining informed consent, the colonoscope                            was passed under direct vision. Throughout the                            procedure, the patient's blood pressure, pulse, and                            oxygen saturations were monitored continuously. The                             CF-[id number] ([id number]) scope was introduced through                            the anus and advanced to the the cecum, identified                            by appendiceal orifice and ileocecal valve. The                            colonoscopy was performed without difficulty. The                            patient tolerated the procedure well. The quality                            of the bowel preparation was adequate. Scope In: 2:18:04 PM Scope Out: 2:32:36 PM Scope Withdrawal Time: 0 hours 10 minutes 30 seconds  Total Procedure Duration: 0 hours 14 minutes 32 seconds  Findings:      The perianal and digital rectal examinations were normal.      The colon (entire examined portion) appeared normal.      The retroflexed view of the distal rectum and anal verge was normal and       showed no anal or rectal abnormalities. Impression:               - The entire examined colon is normal.                           - The distal rectum and anal verge are normal on                            retroflexion view.                           - No specimens collected. Moderate Sedation:      Moderate (conscious) sedation was personally administered by an       anesthesia professional. The following parameters were monitored: oxygen       saturation, heart rate, blood pressure, and response to care. Recommendation:           -  Patient has a contact number available for                            emergencies. The signs and symptoms of potential                            delayed complications were discussed with the                            patient. Return to normal activities tomorrow.                            Written discharge instructions were provided to the                            patient.                           - Resume previous diet.                           - Continue present medications.                           - No repeat colonoscopy due to age.                            - Return to GI clinic (date not yet determined). Procedure Code(s):        --- Professional ---                           [phone], Colonoscopy, flexible; diagnostic, including                            collection of specimen(s) by brushing or washing,                            when performed (separate procedure) Diagnosis Code(s):        --- Professional ---                           Z12.11, Encounter for screening for malignant                            neoplasm of colon CPT copyright 2019 [employer]. All rights reserved. The codes documented in this report are preliminary and upon coder review may  be revised to meet current compliance requirements. Gerrit Friends. Jasmyn Picha, MD Gennette Pac, MD 07/03/2020 2:41:19 PM This report has been signed electronically. Number of Addenda: 0
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
## 2020-07-03 NOTE — Anesthesia Preprocedure Evaluation (Signed)
Anesthesia Evaluation  Patient identified by MRN, date of birth, ID band Patient awake    Reviewed: Allergy & Precautions, H&P , NPO status , Patient's Chart, lab work & pertinent test results, reviewed documented beta blocker date and time   History of Anesthesia Complications (+) PONV and history of anesthetic complications  Airway Mallampati: II  TM Distance: >3 FB Neck ROM: full    Dental no notable dental hx. (+) Dental Advidsory Given   Pulmonary neg pulmonary ROS, former smoker,    Pulmonary exam normal breath sounds clear to auscultation       Cardiovascular Exercise Tolerance: Good hypertension, negative cardio ROS   Rhythm:regular Rate:Normal     Neuro/Psych PSYCHIATRIC DISORDERS Anxiety Depression Schizophrenia negative neurological ROS     GI/Hepatic Neg liver ROS, GERD  Medicated,  Endo/Other  negative endocrine ROS  Renal/GU negative Renal ROS  negative genitourinary   Musculoskeletal   Abdominal   Peds  Hematology negative hematology ROS (+)   Anesthesia Other Findings   Reproductive/Obstetrics negative OB ROS                             Anesthesia Physical Anesthesia Plan  ASA: II  Anesthesia Plan: General   Post-op Pain Management:    Induction:   PONV Risk Score and Plan: Propofol infusion  Airway Management Planned:   Additional Equipment:   Intra-op Plan:   Post-operative Plan:   Informed Consent: I have reviewed the patients History and Physical, chart, labs and discussed the procedure including the risks, benefits and alternatives for the proposed anesthesia with the patient or authorized representative who has indicated his/her understanding and acceptance.     Dental Advisory Given  Plan Discussed with: CRNA  Anesthesia Plan Comments:         Anesthesia Quick Evaluation

## 2020-07-03 NOTE — Discharge Instructions (Signed)
  Colonoscopy Discharge Instructions  Read the instructions outlined below and refer to this sheet in the next few weeks. These discharge instructions provide you with general information on caring for yourself after you leave the hospital. Your doctor may also give you specific instructions. While your treatment has been planned according to the most current medical practices available, unavoidable complications occasionally occur. If you have any problems or questions after discharge, call Dr. Jena Gauss at [phone]. ACTIVITY  You may resume your regular activity, but move at a slower pace for the next 24 hours.   Take frequent rest periods for the next 24 hours.   Walking will help get rid of the air and reduce the bloated feeling in your belly (abdomen).   No driving for 24 hours (because of the medicine (anesthesia) used during the test).    Do not sign any important legal documents or operate any machinery for 24 hours (because of the anesthesia used during the test).  NUTRITION  Drink plenty of fluids.   You may resume your normal diet as instructed by your doctor.   Begin with a light meal and progress to your normal diet. Heavy or fried foods are harder to digest and may make you feel sick to your stomach (nauseated).   Avoid alcoholic beverages for 24 hours or as instructed.  MEDICATIONS  You may resume your normal medications unless your doctor tells you otherwise.  WHAT YOU CAN EXPECT TODAY  Some feelings of bloating in the abdomen.   Passage of more gas than usual.   Spotting of blood in your stool or on the toilet paper.  IF YOU HAD POLYPS REMOVED DURING THE COLONOSCOPY:  No aspirin products for 7 days or as instructed.   No alcohol for 7 days or as instructed.   Eat a soft diet for the next 24 hours.  FINDING OUT THE RESULTS OF YOUR TEST Not all test results are available during your visit. If your test results are not back during the visit, make an appointment  with your caregiver to find out the results. Do not assume everything is normal if you have not heard from your caregiver or the medical facility. It is important for you to follow up on all of your test results.  SEEK IMMEDIATE MEDICAL ATTENTION IF:  You have more than a spotting of blood in your stool.   Your belly is swollen (abdominal distention).   You are nauseated or vomiting.   You have a temperature over 101.   You have abdominal pain or discomfort that is severe or gets worse throughout the day.   No polyps found today  A future colonoscopy is not recommended unless new symptoms develop.  At patient request, I spoke to Hernandez at [phone] -reviewed findings and recommendations

## 2020-07-03 NOTE — Anesthesia Postprocedure Evaluation (Signed)
Anesthesia Post Note  Patient: Jim Collins  Procedure(s) Performed: COLONOSCOPY WITH PROPOFOL (N/A )  Patient location during evaluation: Phase II Anesthesia Type: General Level of consciousness: awake Pain management: pain level controlled Vital Signs Assessment: post-procedure vital signs reviewed and stable Respiratory status: spontaneous breathing and respiratory function stable Cardiovascular status: blood pressure returned to baseline and stable Postop Assessment: no headache and no apparent nausea or vomiting Anesthetic complications: no Comments: Late entry   No complications documented.   Last Vitals:  Vitals:   07/03/20 1244 07/03/20 1434  BP: (!) 142/91 111/66  Pulse: 76   Resp: 20 20  Temp: 36.8 C 36.6 C  SpO2: 97% 97%    Last Pain:  Vitals:   07/03/20 1434  TempSrc: Oral  PainSc: 0-No pain                 Windell Norfolk

## 2020-07-08 ENCOUNTER — Other Ambulatory Visit: Payer: Self-pay | Admitting: Gastroenterology

## 2020-07-14 ENCOUNTER — Encounter (HOSPITAL_COMMUNITY): Payer: Self-pay | Admitting: Internal Medicine

## 2020-11-21 IMAGING — CT CT ABD-PELV W/ CM
2 of 5 series · 16 of 46 positions shown, 18 images · IV contrast (Omnipaque or Isovue)
Comparison: 07/03/2019

CLINICAL DATA: Generalized abdominal pain, epigastric pain

EXAM:
CT ABDOMEN AND PELVIS WITH CONTRAST
TECHNIQUE: Multidetector CT imaging of the abdomen and pelvis was performed
using the standard protocol following bolus administration of
intravenous contrast.
CONTRAST:  100mL OMNIPAQUE IOHEXOL 300 MG/ML  SOLN

[Series 2: axial st · axial · 0.87mm/px · z∈[+1064,+1544]mm · 13 of 109 slices shown, 15 images]
[im 7/109  soft-tissue]
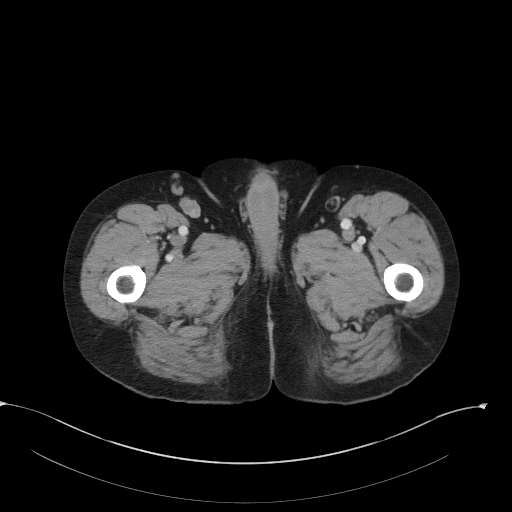
[im 7/109  bone]
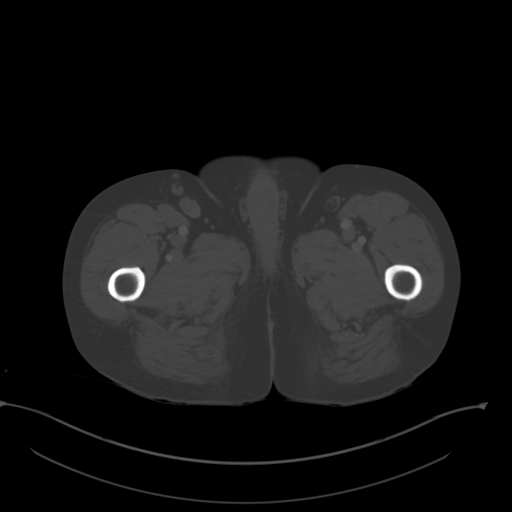
[im 13/109  soft-tissue]
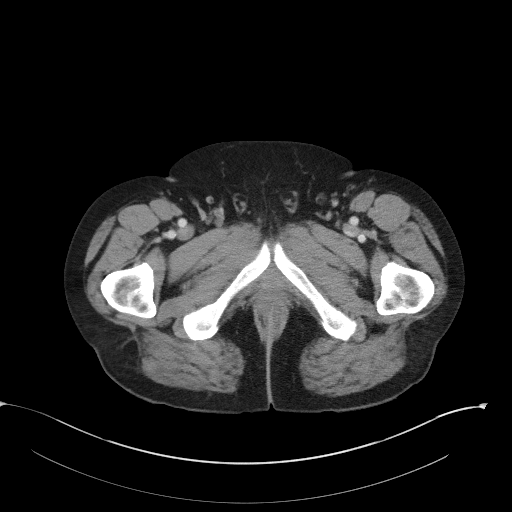
[im 25/109  soft-tissue]
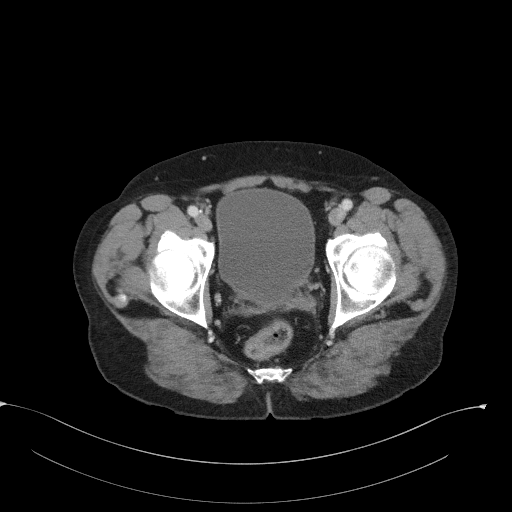
[im 31/109  soft-tissue]
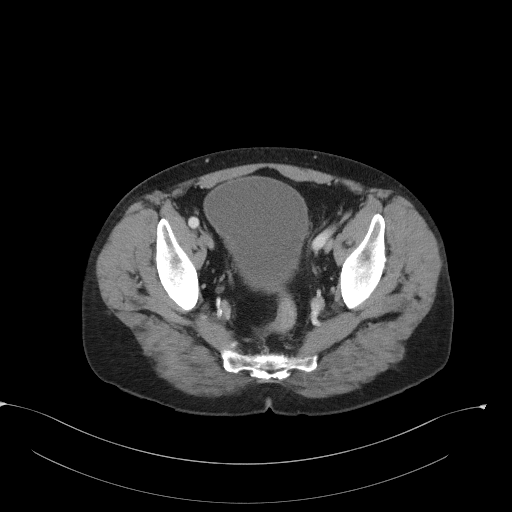
[im 37/109  soft-tissue]
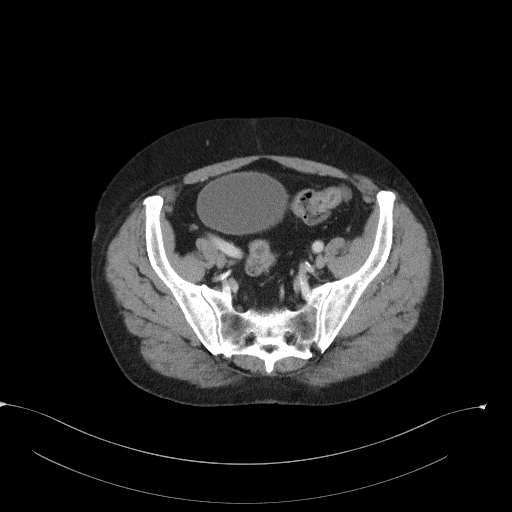
[im 49/109  soft-tissue]
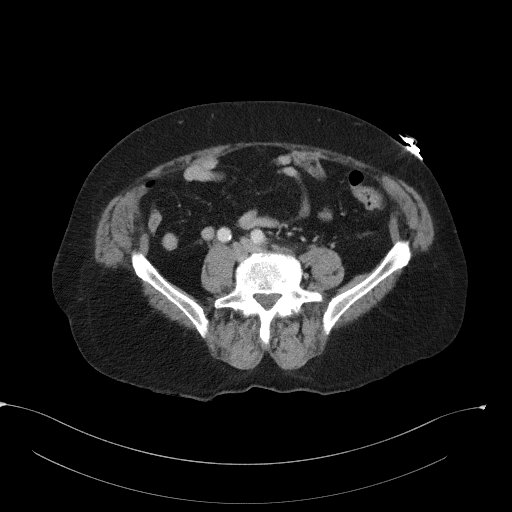
[im 55/109  soft-tissue]
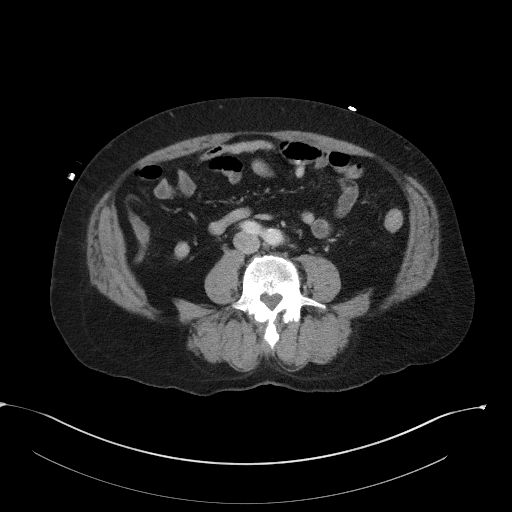
[im 61/109  soft-tissue]
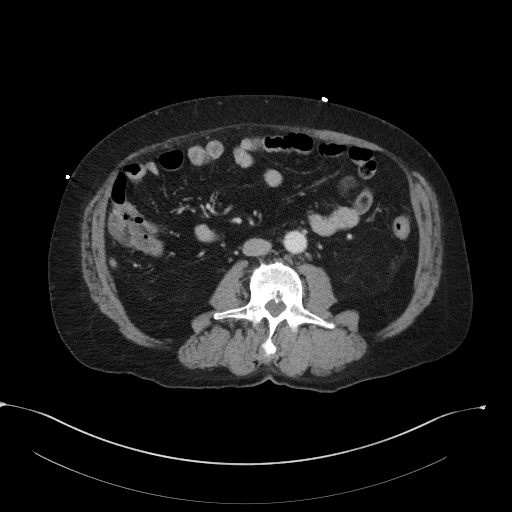
[im 73/109  soft-tissue]
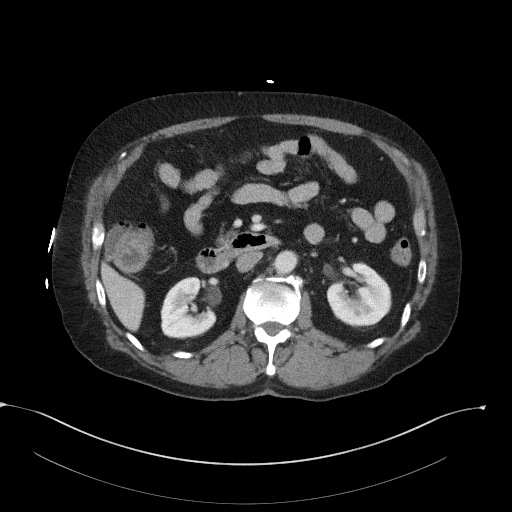
[im 73/109  bone]
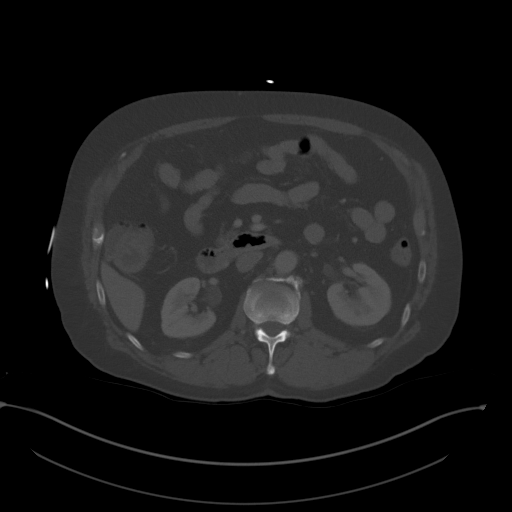
[im 79/109  soft-tissue]
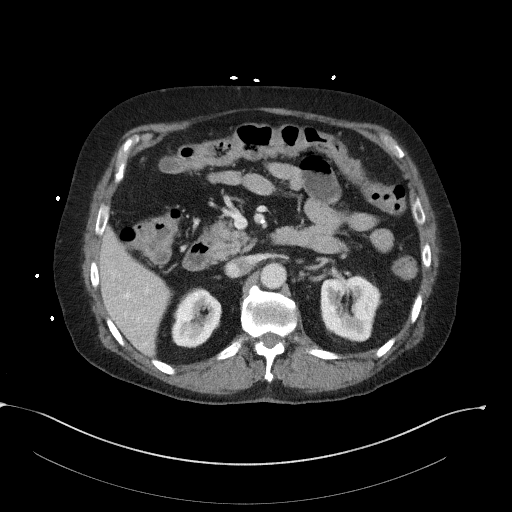
[im 85/109  soft-tissue]
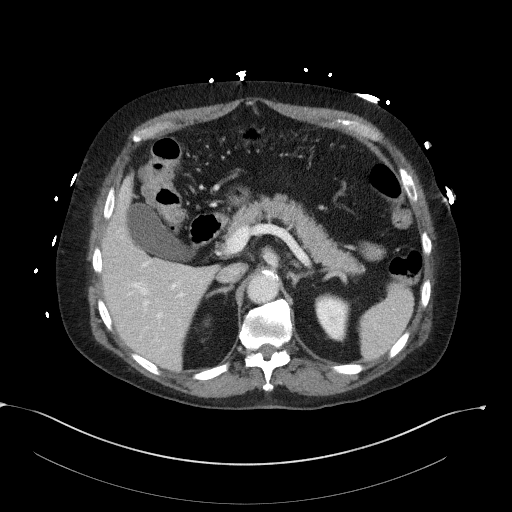
[im 97/109  soft-tissue]
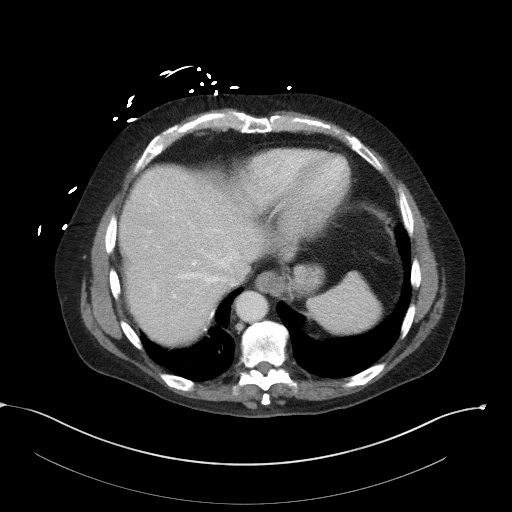
[im 103/109  soft-tissue]
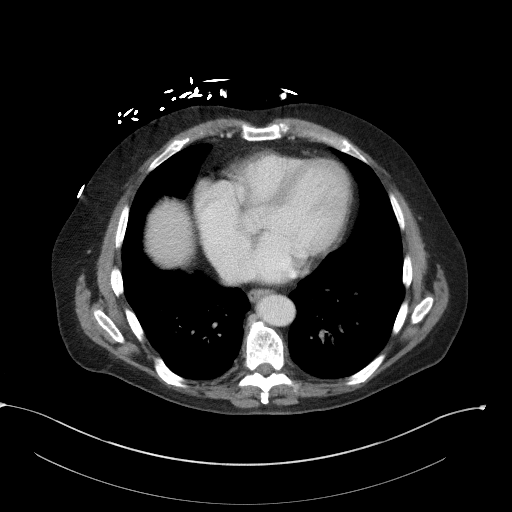

[Series 6: coronal st · coronal · 0.81mm/px · 3 of 104 slices shown]
[im 35/104  soft-tissue]
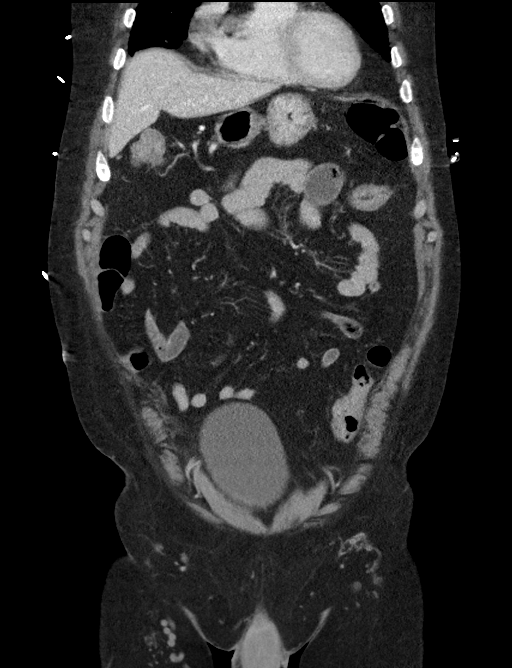
[im 46/104  soft-tissue]
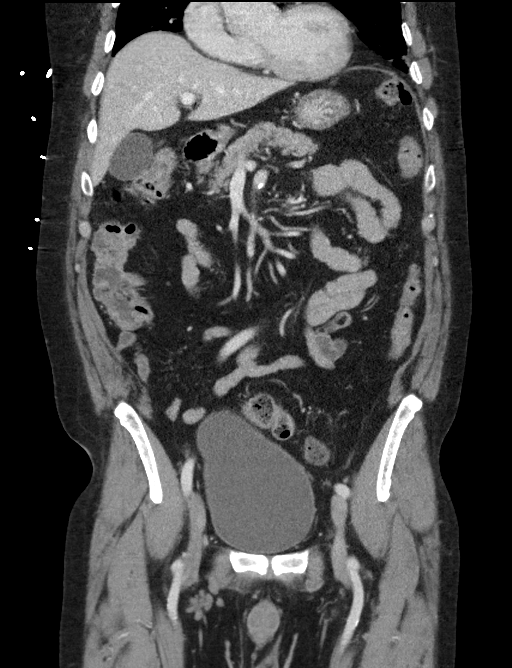
[im 58/104  soft-tissue]
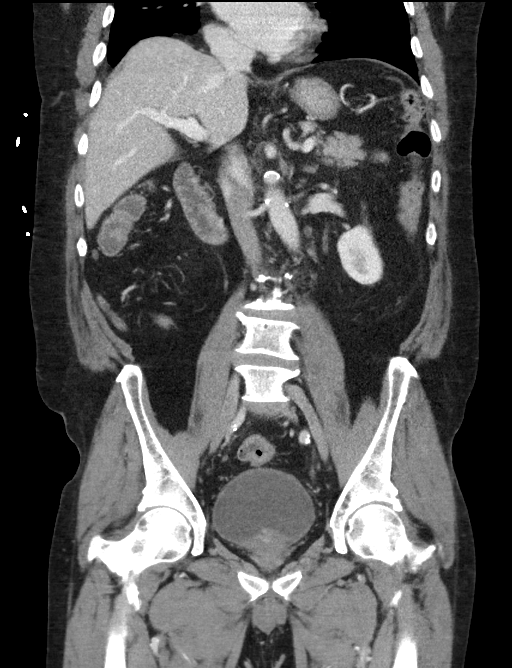

[16 of 46 positions shown; findings below may reference images not displayed]

FINDINGS: Lower chest: Lung bases are clear. No effusions. Heart is normal
size.

Hepatobiliary: No focal hepatic abnormality. Gallbladder
unremarkable.

Pancreas: No focal abnormality or ductal dilatation.

Spleen: No focal abnormality.  Normal size.

Adrenals/Urinary Tract: Small cyst in the lower pole of the right
kidney is stable. No hydronephrosis. Adrenal glands and urinary
bladder unremarkable.

Stomach/Bowel: Normal appendix. Previously seen prominent small
bowel loops in the lower abdomen/pelvis have resolved. Stomach and
small bowel decompressed, unremarkable. No evidence of bowel
obstruction.

Vascular/Lymphatic: Aortoiliac atherosclerosis. No aneurysm or
adenopathy.

Reproductive: Mildly prominent prostate with central calcifications.

Other: No free fluid or free air.

Musculoskeletal: No acute bony abnormality
IMPRESSION: No acute findings in the abdomen or pelvis.

Aortoiliac atherosclerosis.

Mildly prominent prostate.

## 2021-01-07 ENCOUNTER — Other Ambulatory Visit: Payer: Self-pay | Admitting: Gastroenterology

## 2021-03-10 IMAGING — US US ABDOMEN LIMITED
1 series · 14 of 25 positions shown · non-contrast
Comparison: CT abdomen/pelvis 08/31/2019.

CLINICAL DATA: Abdominal pain, epigastric. Gastroesophageal reflux
disease without esophagitis. Epigastric pain.

EXAM:
ULTRASOUND ABDOMEN LIMITED RIGHT UPPER QUADRANT

[Series 1: us abdomen limited · 0.19mm/px · 14 of 81 slices shown]
[im 1/81]
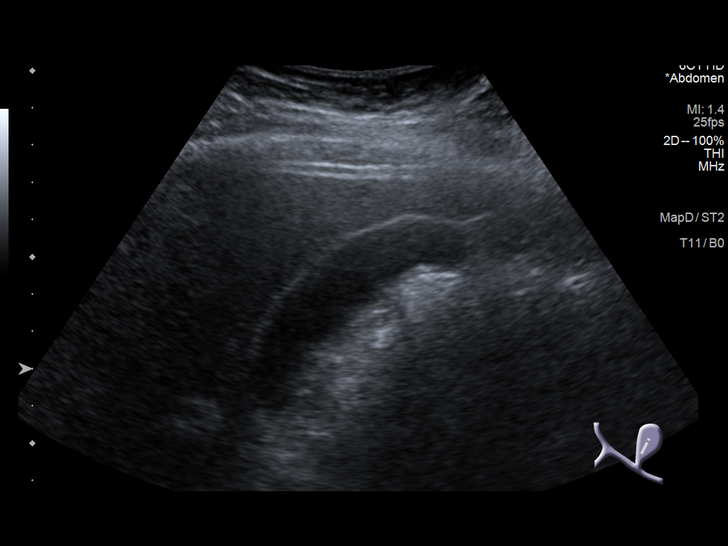
[im 7/81]
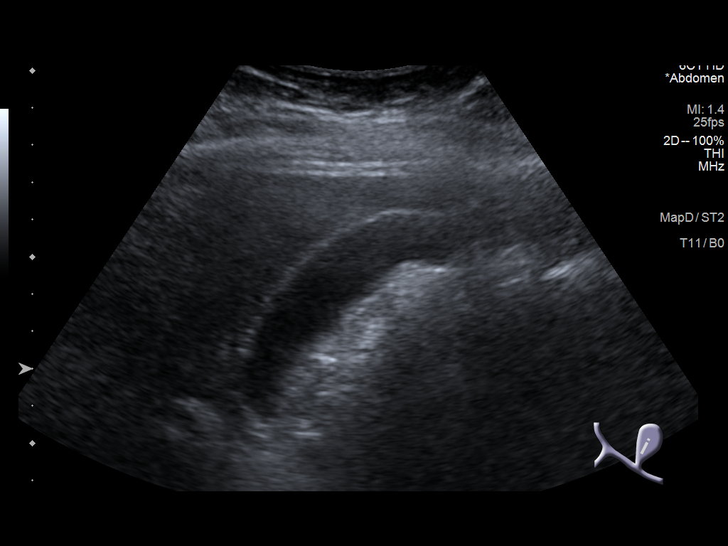
[im 14/81]
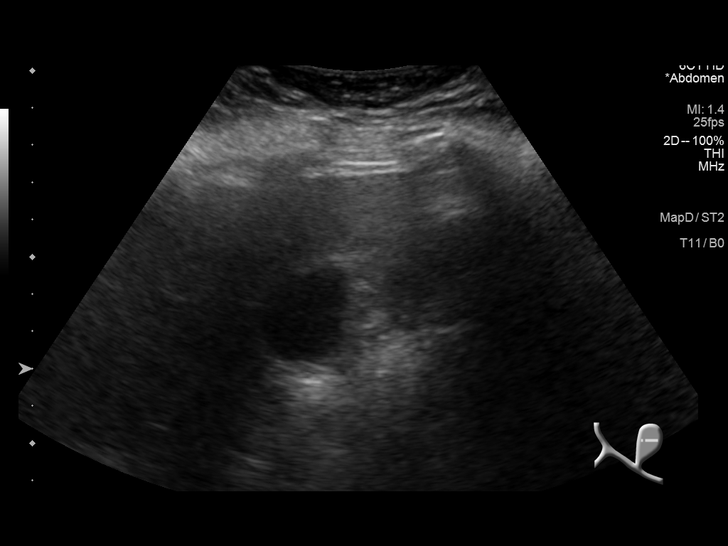
[im 21/81]
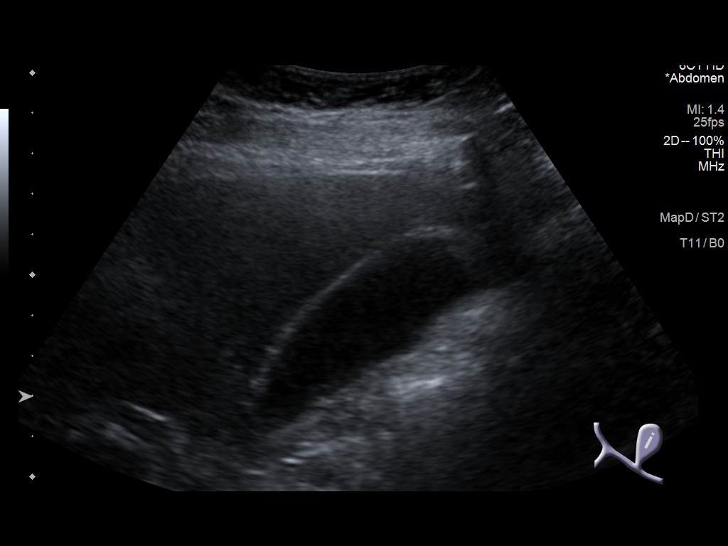
[im 27/81]
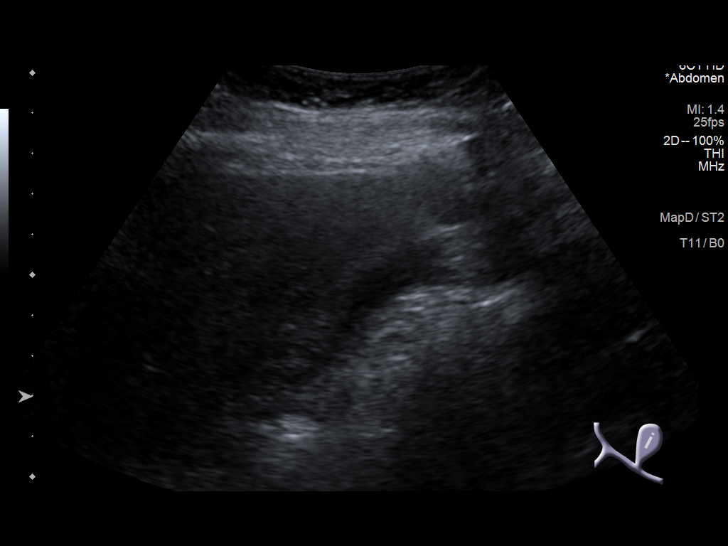
[im 31/81]
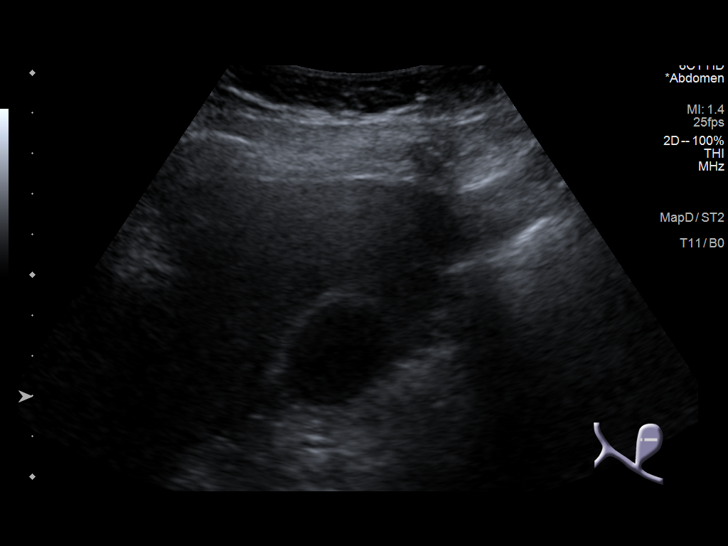
[im 37/81]
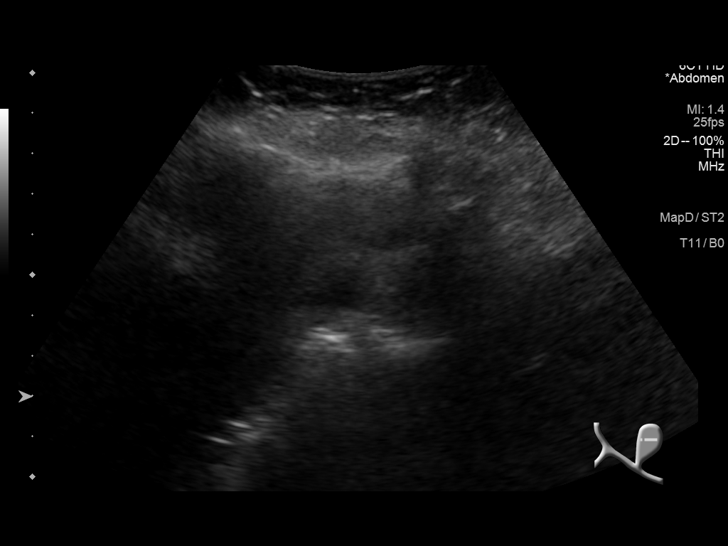
[im 44/81]
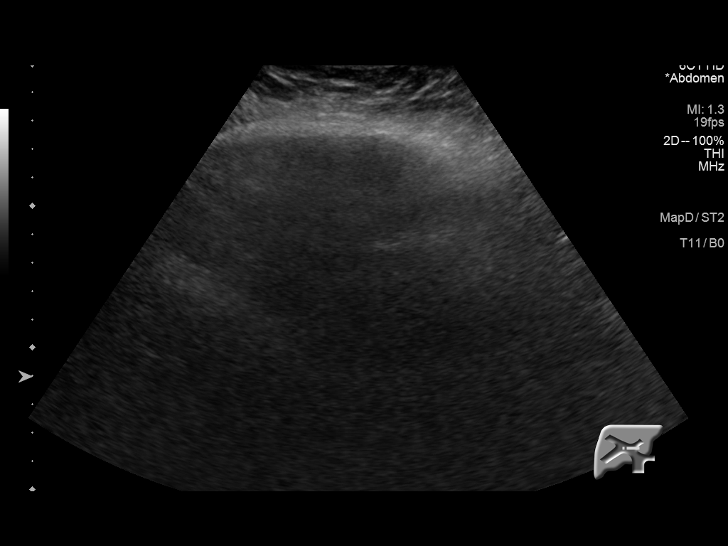
[im 51/81]
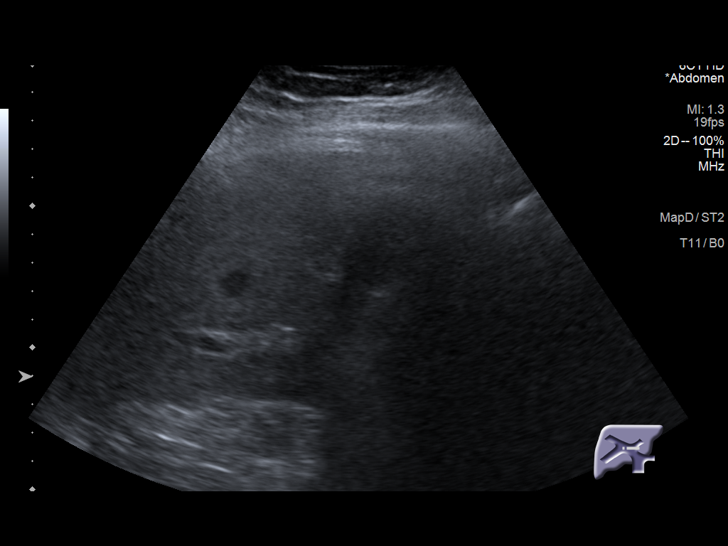
[im 54/81]
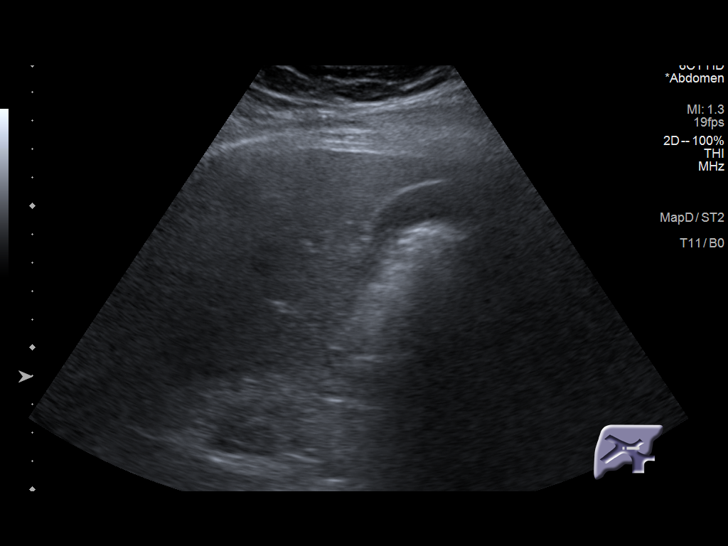
[im 61/81]
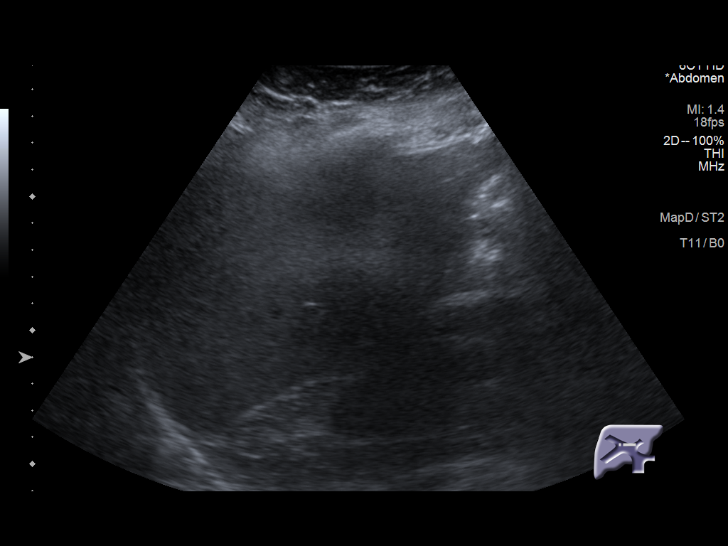
[im 67/81]
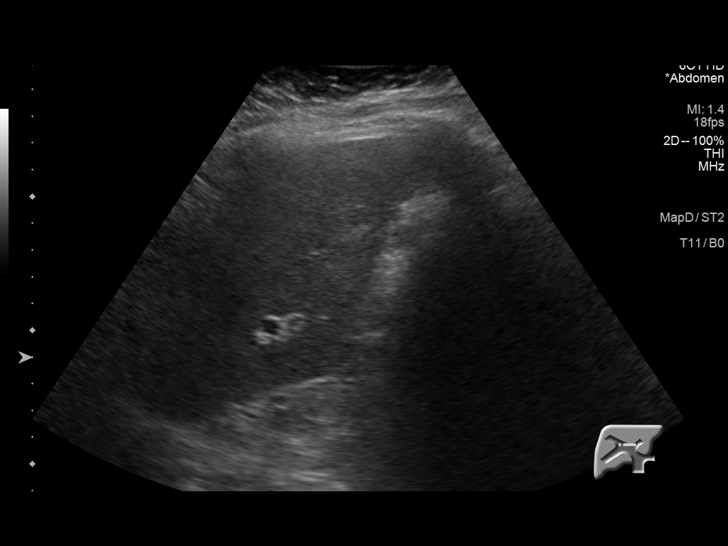
[im 74/81]
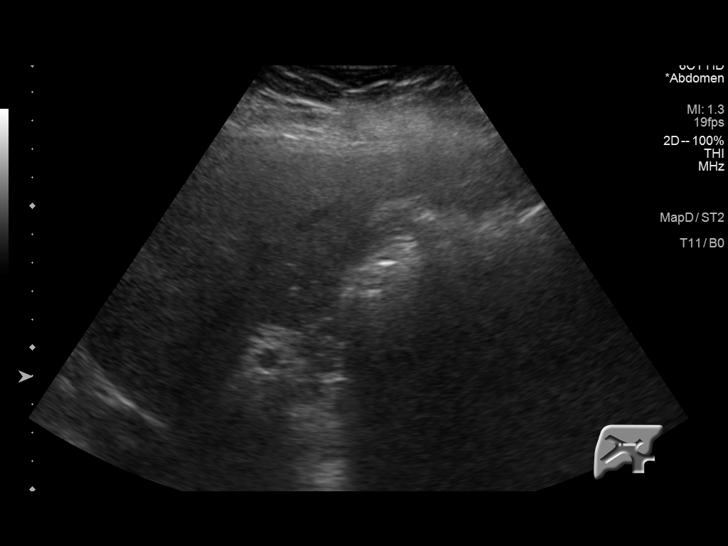
[im 81/81]
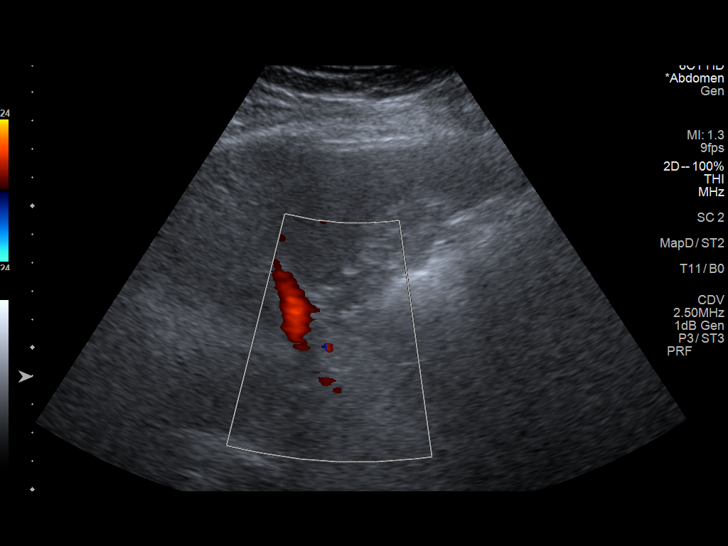

[14 of 25 positions shown; findings below may reference images not displayed]

FINDINGS: Gallbladder:

No gallstones or wall thickening visualized. No sonographic Murphy
sign noted by sonographer.

Common bile duct:

Diameter: 3-4 mm, within normal limits.

Liver:

The left hepatic lobe is poorly visualized due to poor acoustic
windows. Generalized increased echogenicity of the visualized
hepatic parenchyma. No focal hepatic lesion is identified. Portal
vein is patent on color Doppler imaging with normal direction of
blood flow towards the liver.
IMPRESSION: Limited assessment of the left hepatic lobe due to poor acoustic
windows.

Hyperechogenicity of the hepatic parenchyma. This is a nonspecific
finding, which may be seen in the setting of hepatic steatosis or
other chronic hepatic parenchymal disease.

Otherwise unremarkable right upper quadrant ultrasound, as
described.

## 2021-06-09 ENCOUNTER — Encounter (HOSPITAL_COMMUNITY): Payer: Self-pay | Admitting: Emergency Medicine

## 2021-06-09 ENCOUNTER — Emergency Department (HOSPITAL_COMMUNITY)
Admission: EM | Admit: 2021-06-09 | Discharge: 2021-06-09 | Disposition: A | Discharge status: Home / Self Care | Payer: Medicare Other | Attending: Emergency Medicine | Admitting: Emergency Medicine

## 2021-06-09 ENCOUNTER — Emergency Department (HOSPITAL_COMMUNITY): Payer: Medicare Other

## 2021-06-09 ENCOUNTER — Other Ambulatory Visit: Payer: Self-pay

## 2021-06-09 DIAGNOSIS — R1084 Generalized abdominal pain: Secondary | ICD-10-CM | POA: Insufficient documentation

## 2021-06-09 DIAGNOSIS — I10 Essential (primary) hypertension: Secondary | ICD-10-CM | POA: Insufficient documentation

## 2021-06-09 DIAGNOSIS — R110 Nausea: Secondary | ICD-10-CM | POA: Diagnosis not present

## 2021-06-09 DIAGNOSIS — Z79899 Other long term (current) drug therapy: Secondary | ICD-10-CM | POA: Diagnosis not present

## 2021-06-09 LAB — CBC WITH DIFFERENTIAL/PLATELET
Abs Immature Granulocytes: 0.02 10*3/uL (ref 0.00–0.07)
Basophils Absolute: 0 10*3/uL (ref 0.0–0.1)
Basophils Relative: 0 %
Eosinophils Absolute: 0 10*3/uL (ref 0.0–0.5)
Eosinophils Relative: 0 %
HCT: 40.4 % (ref 39.0–52.0)
Hemoglobin: 14.4 g/dL (ref 13.0–17.0)
Immature Granulocytes: 0 %
Lymphocytes Relative: 25 %
Lymphs Abs: 2.2 10*3/uL (ref 0.7–4.0)
MCH: 30.7 pg (ref 26.0–34.0)
MCHC: 35.6 g/dL (ref 30.0–36.0)
MCV: 86.1 fL (ref 80.0–100.0)
Monocytes Absolute: 0.8 10*3/uL (ref 0.1–1.0)
Monocytes Relative: 9 %
Neutro Abs: 5.9 10*3/uL (ref 1.7–7.7)
Neutrophils Relative %: 66 %
Platelets: 235 10*3/uL (ref 150–400)
RBC: 4.69 MIL/uL (ref 4.22–5.81)
RDW: 12.5 % (ref 11.5–15.5)
WBC: 9 10*3/uL (ref 4.0–10.5)
nRBC: 0 % (ref 0.0–0.2)

## 2021-06-09 LAB — COMPREHENSIVE METABOLIC PANEL
ALT: 15 U/L (ref 0–44)
AST: 16 U/L (ref 15–41)
Albumin: 3.7 g/dL (ref 3.5–5.0)
Alkaline Phosphatase: 89 U/L (ref 38–126)
Anion gap: 5 (ref 5–15)
BUN: 9 mg/dL (ref 8–23)
CO2: 23 mmol/L (ref 22–32)
Calcium: 8.9 mg/dL (ref 8.9–10.3)
Chloride: 106 mmol/L (ref 98–111)
Creatinine, Ser: 0.71 mg/dL (ref 0.61–1.24)
GFR, Estimated: >60 mL/min (ref >60)
Glucose, Bld: 101 mg/dL — ABNORMAL HIGH (ref 70–99)
Potassium: 3.5 mmol/L (ref 3.5–5.1)
Sodium: 134 mmol/L — ABNORMAL LOW (ref 135–145)
Total Bilirubin: 0.6 mg/dL (ref 0.3–1.2)
Total Protein: 6.9 g/dL (ref 6.5–8.1)

## 2021-06-09 LAB — LIPASE, BLOOD: Lipase: 28 U/L (ref 11–51)

## 2021-06-09 LAB — URINALYSIS, ROUTINE W REFLEX MICROSCOPIC
Bilirubin Urine: NEGATIVE
Glucose, UA: NEGATIVE mg/dL
Hgb urine dipstick: NEGATIVE
Ketones, ur: NEGATIVE mg/dL
Leukocytes,Ua: NEGATIVE
Nitrite: NEGATIVE
Protein, ur: NEGATIVE mg/dL
Specific Gravity, Urine: 1.004 — ABNORMAL LOW (ref 1.005–1.030)
pH: 7 (ref 5.0–8.0)

## 2021-06-09 LAB — TROPONIN I (HIGH SENSITIVITY): Troponin I (High Sensitivity): 3 ng/L (ref <18)

## 2021-06-09 MED ORDER — FENTANYL CITRATE PF 50 MCG/ML IJ SOSY
50.0000 ug | PREFILLED_SYRINGE | Freq: Once | INTRAMUSCULAR | Status: AC
  Administered 2021-06-09: 50 ug via INTRAVENOUS
  Filled 2021-06-09: qty 1

## 2021-06-09 MED ORDER — IOHEXOL 300 MG/ML  SOLN
100.0000 mL | Freq: Once | INTRAMUSCULAR | Status: AC | PRN
  Administered 2021-06-09: 100 mL via INTRAVENOUS

## 2021-06-09 MED ORDER — ONDANSETRON HCL 4 MG/2ML IJ SOLN
4.0000 mg | Freq: Once | INTRAMUSCULAR | Status: AC
  Administered 2021-06-09: 4 mg via INTRAVENOUS
  Filled 2021-06-09: qty 2

## 2021-06-09 NOTE — ED Provider Notes (Signed)
?[facility] ?Provider Note ? ? ?CSN: [id number] ?Arrival date & time: 06/09/21  0100 ? ?  ? ?History ? ?Chief Complaint  ?Patient presents with  ? Abdominal Pain  ? ? ?Jim Collins is a 71 y.o. male. ? ?The history is provided by the patient. The history is limited by a language barrier. A language interpreter was used Madaline Guthrie [phone]).  ?Abdominal Pain ?Pain location:  Generalized ?Pain radiates to:  Back ?Pain severity:  Severe ?Onset quality:  Sudden ?Timing:  Constant ?Progression:  Worsening ?Chronicity:  New ?Associated symptoms: nausea   ?Associated symptoms: no chest pain, no diarrhea, no dysuria, no fever, no hematochezia and no shortness of breath   ?Risk factors: has not had multiple surgeries   ? ?  ? ?Home Medications ?Prior to Admission medications   ?Medication Sig Start Date End Date Taking? Authorizing Provider  ?losartan (COZAAR) 50 MG tablet Take 50 mg by mouth daily. 03/21/20   [provider]  ?OLANZapine (ZYPREXA) 15 MG tablet Take 15 mg by mouth at bedtime. 03/10/20   [provider]  ?ondansetron (ZOFRAN ODT) 4 MG disintegrating tablet Take 1 tablet (4 mg total) by mouth every 6 (six) hours as needed for nausea or vomiting. 04/04/20   Tiffany Kocher, PA-C  ?Oxycodone HCl 10 MG TABS Take 10 mg by mouth 3 (three) times daily as needed. 03/17/20   [provider]  ?pantoprazole (PROTONIX) 40 MG tablet TAKE (1) TABLET TWICE A DAY BEFORE MEALS. 01/10/21   Tiffany Kocher, PA-C  ?polyethylene glycol (MIRALAX MIX-IN PAX) 17 g packet Take 17 grams (one pack) mixed in 4-6 ounces of liquid twice daily until soft bowel movement, then continue once daily thereafter. 04/04/20   Tiffany Kocher, PA-C  ?sucralfate (CARAFATE) 1 g tablet Take 1 g by mouth in the morning, at noon, and at bedtime. As needed    [provider]  ?   ? ?Allergies    ?Haloperidol lactate, Morphine and related, Compazine [prochlorperazine], and Thorazine [chlorpromazine]   ? ?Review of  Systems   ?Review of Systems  ?Constitutional:  Negative for fever.  ?Respiratory:  Negative for shortness of breath.   ?Cardiovascular:  Negative for chest pain.  ?Gastrointestinal:  Positive for abdominal pain and nausea. Negative for blood in stool, diarrhea and hematochezia.  ?Genitourinary:  Negative for dysuria.  ? ?Physical Exam ?Updated Vital Signs ?BP (!) 154/95   Pulse 77   Temp 98.4 ?F (36.9 ?C) (Oral)   Resp (!) 23   Ht 1.702 m (5\' 7" )   Wt 90.3 kg   SpO2 95%   BMI 31.17 kg/m?  ?Physical Exam ?CONSTITUTIONAL: Elderly, no acute distress ?HEAD: Normocephalic/atraumatic ?EYES: EOMI/PERRL ?ENMT: Mucous membranes moist ?NECK: supple no meningeal signs ?SPINE/BACK:entire spine nontender ?CV: S1/S2 noted, no murmurs/rubs/gallops noted ?LUNGS: Lungs are clear to auscultation bilaterally, no apparent distress ?ABDOMEN: soft, mild mid abdomen tenderness, no rebound or guarding, bowel sounds noted throughout abdomen, obese ?GU:no cva tenderness ?NEURO: Pt is awake/alert/appropriate, moves all extremitiesx4.  No facial droop.   ?EXTREMITIES: pulses normal/equal, full ROM ?SKIN: warm, color normal ?PSYCH: Flat affect ?ED Results / Procedures / Treatments   ?Labs ?(all labs ordered are listed, but only abnormal results are displayed) ?Labs Reviewed  ?COMPREHENSIVE METABOLIC PANEL - Abnormal; Notable for the following components:  ?    Result Value  ? Sodium 134 (*)   ? Glucose, Bld 101 (*)   ? All other components within normal limits  ?URINALYSIS,  ROUTINE W REFLEX MICROSCOPIC - Abnormal; Notable for the following components:  ? Color, Urine STRAW (*)   ? Specific Gravity, Urine 1.004 (*)   ? All other components within normal limits  ?LIPASE, BLOOD  ?CBC WITH DIFFERENTIAL/PLATELET  ?TROPONIN I (HIGH SENSITIVITY)  ? ? ?EKG ?EKG Interpretation ? ?Date/Time:  Tuesday Jun 09 2021 01:03:39 EDT ?Ventricular Rate:  75 ?PR Interval:  165 ?QRS Duration: 101 ?QT Interval:  390 ?QTC Calculation: 436 ?R Axis:   44 ?Text  Interpretation: Sinus rhythm No significant change since last tracing Confirmed by Zadie RhineWickline, Leotta Weingarten ([id number]) on 06/09/2021 1:08:56 AM ? ?Radiology ?CT ABDOMEN PELVIS W CONTRAST ? ?Result Date: 06/09/2021 ?CLINICAL DATA:  Abdominal pain, nausea EXAM: CT ABDOMEN AND PELVIS WITH CONTRAST TECHNIQUE: Multidetector CT imaging of the abdomen and pelvis was performed using the standard protocol following bolus administration of intravenous contrast. RADIATION DOSE REDUCTION: This exam was performed according to the departmental dose-optimization program which includes automated exposure control, adjustment of the mA and/or kV according to patient size and/or use of iterative reconstruction technique. CONTRAST:  100mL OMNIPAQUE IOHEXOL 300 MG/ML  SOLN COMPARISON:  08/31/2019 FINDINGS: Lower chest: No acute abnormality Hepatobiliary: No focal hepatic abnormality. Gallbladder unremarkable. Pancreas: No focal abnormality or ductal dilatation. Spleen: No focal abnormality.  Normal size. Adrenals/Urinary Tract: Small cyst off the lower pole of the right kidney. No stones or hydronephrosis. Adrenal glands and urinary bladder unremarkable. Stomach/Bowel: Normal appendix. Stomach, large and small bowel grossly unremarkable. Vascular/Lymphatic: No evidence of aneurysm or adenopathy. Aortic atherosclerosis. Reproductive: Prostate enlargement with central calcifications. Other: No free fluid or free air. Musculoskeletal: No acute bony abnormality. IMPRESSION: No acute findings in the abdomen or pelvis. Aortic atherosclerosis. Prostate enlargement. Electronically Signed   By: Charlett NoseKevin  Dover M.D.   On: 06/09/2021 02:41   ? ?Procedures ?Procedures  ? ? ?Medications Ordered in ED ?Medications  ?fentaNYL (SUBLIMAZE) injection 50 mcg (50 mcg Intravenous Given 06/09/21 0149)  ?ondansetron [facility](ZOFRAN) injection 4 mg (4 mg Intravenous Given 06/09/21 0148)  ?iohexol (OMNIPAQUE) 300 MG/ML solution 100 mL (100 mLs Intravenous Contrast Given 06/09/21 0216)   ? ? ?ED Course/ Medical Decision Making/ A&P ?Clinical Course as of 06/09/21 0307  ?Tue Jun 09, 2021  ?0305 Used interpreter Darien RamusAna [phone] at time of discharge.  Pt informed of CT results.  Informed to f/u with his GI physician ? [DW]  ?  ?Clinical Course User Index ?[DW] Zadie RhineWickline, Tremaine Earwood, MD  ? ?                        ?Medical Decision Making ?Amount and/or Complexity of Data Reviewed ?Labs: ordered. ?Radiology: ordered. ? ?Risk ?Prescription drug management. ? ? ?This patient presents to the ED for concern of abdominal pain, this involves an extensive number of treatment options, and is a complaint that carries with it a high risk of complications and morbidity.  The differential diagnosis includes but is not limited to cholecystitis, cholelithiasis, appendicitis, acute coronary syndrome, pancreatitis, bowel obstruction ? ?Comorbidities that complicate the patient evaluation: ?Patient?s presentation is complicated by their history of hypertension ? ?Social Determinants of Health: ?Patient?s  English as a second language   increases the complexity of managing their presentation ? ?Additional history obtained: ?Records reviewed  GI notes reviewed ? ?Lab Tests: ?I Ordered, and personally interpreted labs.  The pertinent results include:  no acute findings ? ?Imaging Studies ordered: ?I ordered imaging studies including CT scan abdomen pelvis   ?I independently visualized  and interpreted imaging which showed no acute findings ?I agree with the radiologist interpretation ? ?Medicines ordered and prescription drug management: ?I ordered medication including fentanyl  for pain  ?Reevaluation of the patient after these medicines showed that the patient    improved ? ? ?Reevaluation ?After the interventions noted above, I reevaluated the patient and found that they have :improved ? ?Complexity of problems addressed: ?Patient?s presentation is most consistent with  acute presentation with potential threat to life or bodily  function ? ?Disposition: ?After consideration of the diagnostic results and the patient?s response to treatment,  ?I feel that the patent would benefit from discharge   .  ? ? ? ? ? ? ? ? ? ?Final Clinical Impressio

## 2021-06-09 NOTE — ED Triage Notes (Signed)
Pt c/o abd pain with nausea.  

## 2021-07-20 ENCOUNTER — Other Ambulatory Visit: Payer: Self-pay | Admitting: Gastroenterology

## 2021-07-20 NOTE — Telephone Encounter (Signed)
Noted  

## 2021-11-11 ENCOUNTER — Other Ambulatory Visit: Payer: Self-pay | Admitting: Gastroenterology

## 2021-11-11 ENCOUNTER — Telehealth: Payer: Self-pay

## 2021-11-11 NOTE — Telephone Encounter (Signed)
Already addressed

## 2021-11-11 NOTE — Telephone Encounter (Signed)
Refill request for Pantoprazole 40 mg  Qty: 60 was received from [employer] Pt was last seen on 05/14/20.

## 2022-08-31 IMAGING — CT CT ABD-PELV W/ CM
3 of 5 series · 17 of 46 positions shown, 19 images · IV contrast (Omnipaque or Isovue)
Comparison: 08/31/2019

CLINICAL DATA: Abdominal pain, nausea

EXAM:
CT ABDOMEN AND PELVIS WITH CONTRAST
TECHNIQUE: Multidetector CT imaging of the abdomen and pelvis was performed
using the standard protocol following bolus administration of
intravenous contrast.

[Series 2: axial st · axial · 0.73mm/px · z∈[+1039,+1434]mm · 11 of 95 slices shown, 13 images]
[im 8/95  soft-tissue]
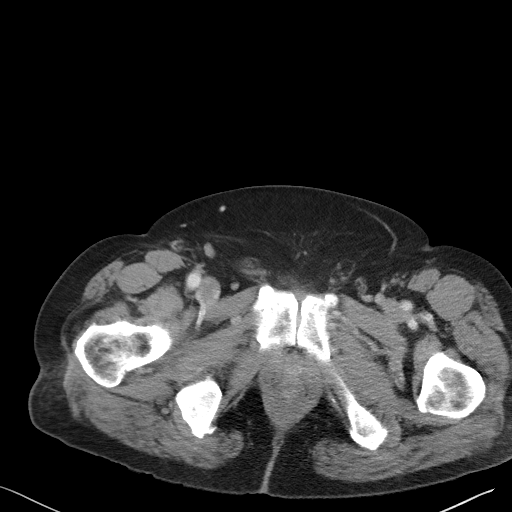
[im 8/95  bone]
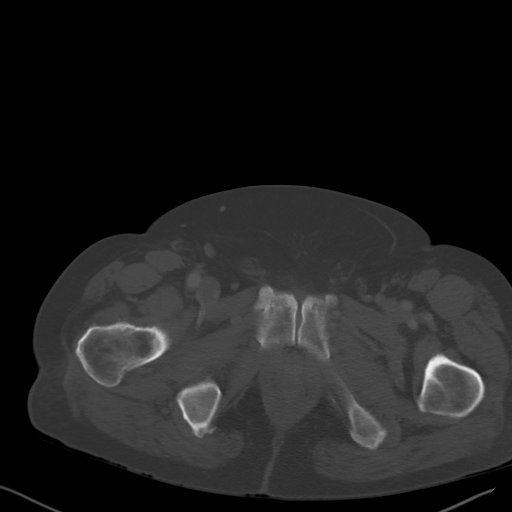
[im 16/95  soft-tissue]
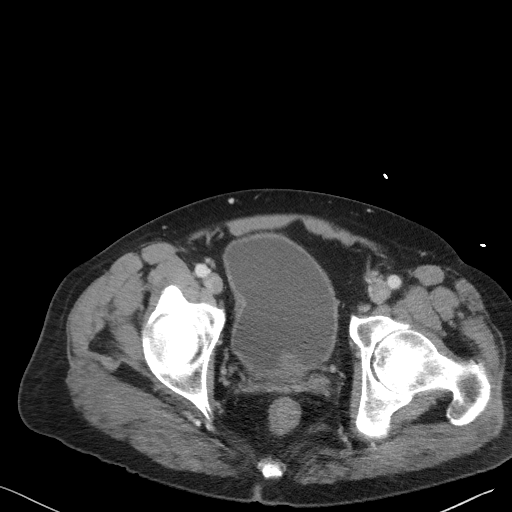
[im 24/95  soft-tissue]
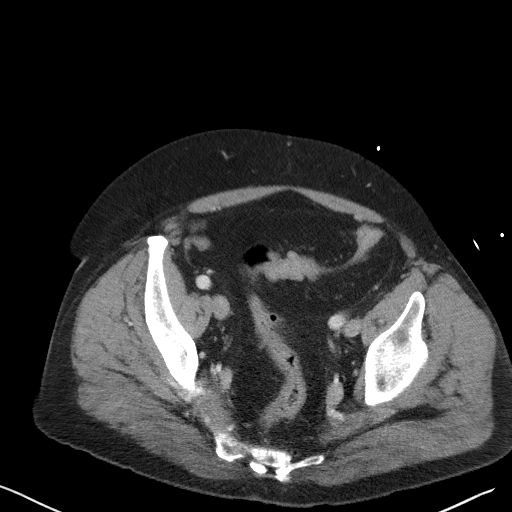
[im 32/95  soft-tissue]
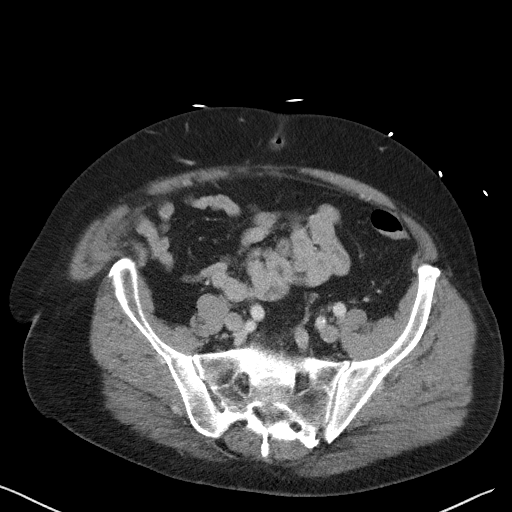
[im 40/95  soft-tissue]
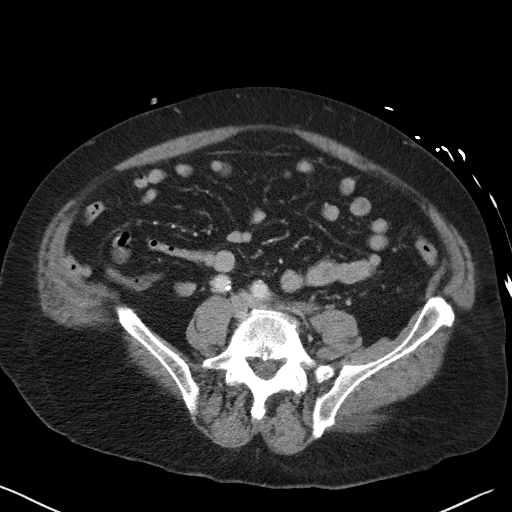
[im 48/95  soft-tissue]
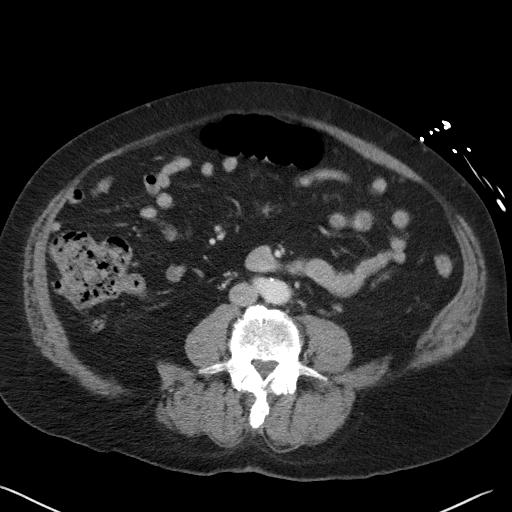
[im 55/95  soft-tissue]
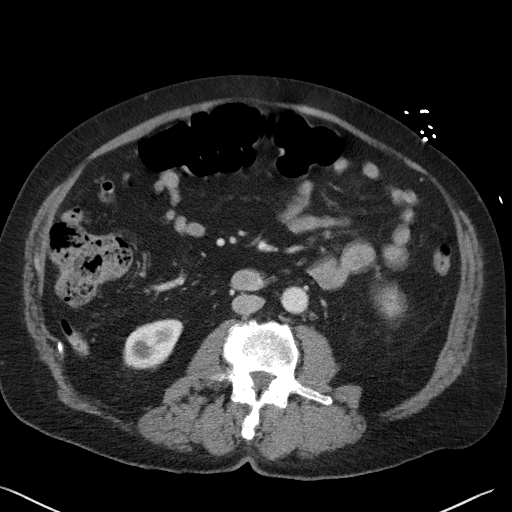
[im 63/95  soft-tissue]
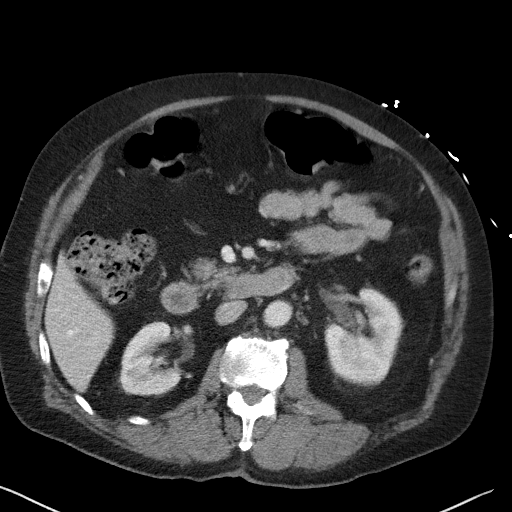
[im 71/95  soft-tissue]
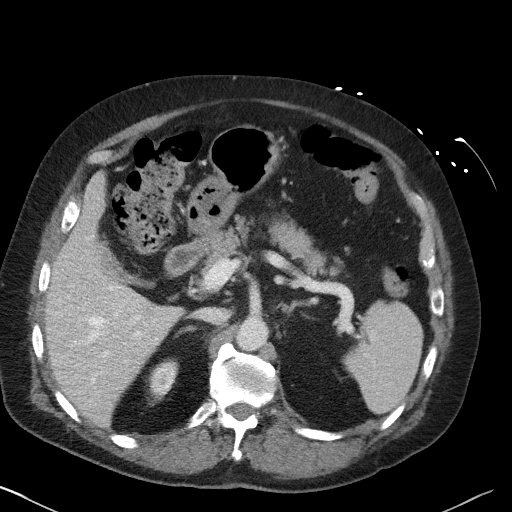
[im 71/95  bone]
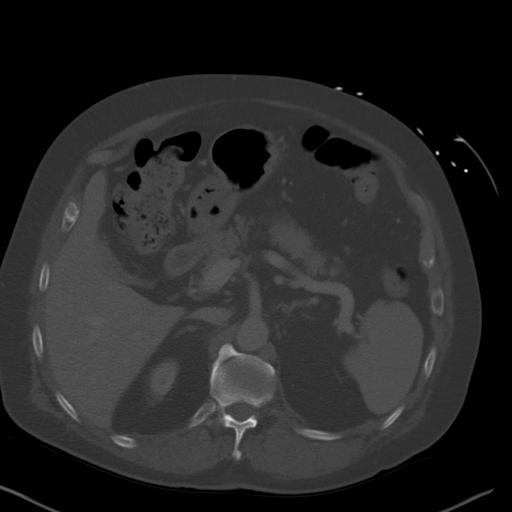
[im 79/95  soft-tissue]
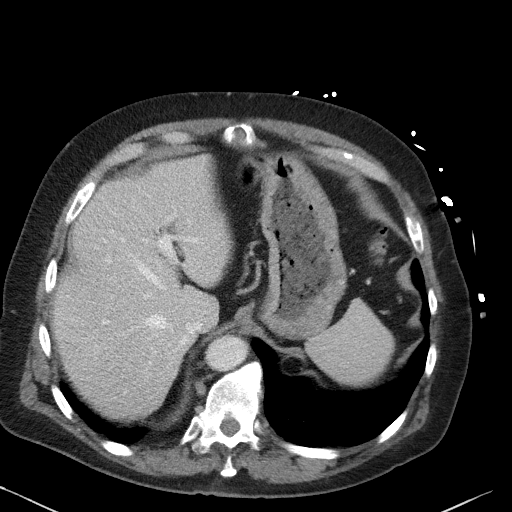
[im 87/95  soft-tissue]
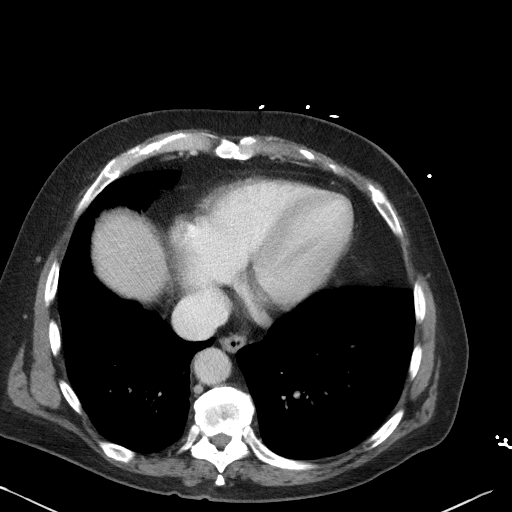

[Series 4: lung bases · axial · 0.73mm/px · z∈[+1039,+1144]mm · 3 of 95 slices shown]
[im 8/95  bone]
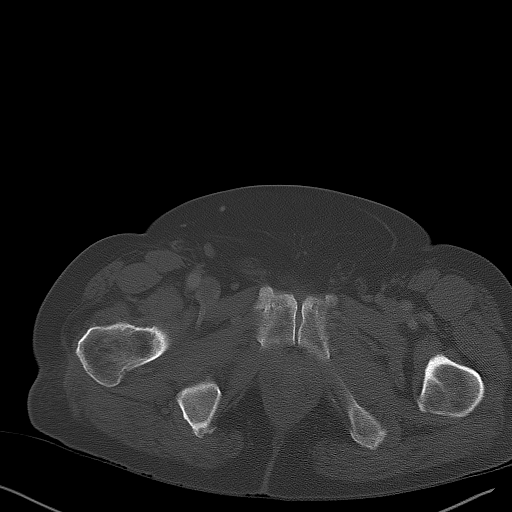
[im 22/95  bone]
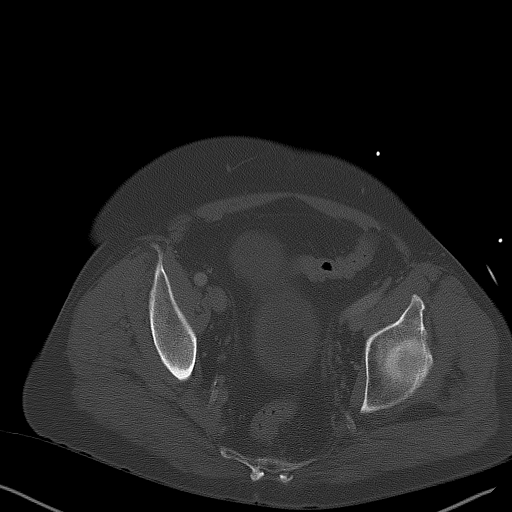
[im 29/95  bone]
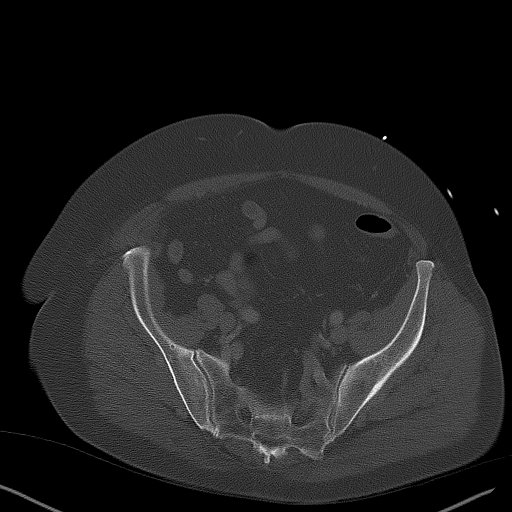

[Series 5: coronal st · coronal · 0.82mm/px · 3 of 95 slices shown]
[im 32/95  soft-tissue]
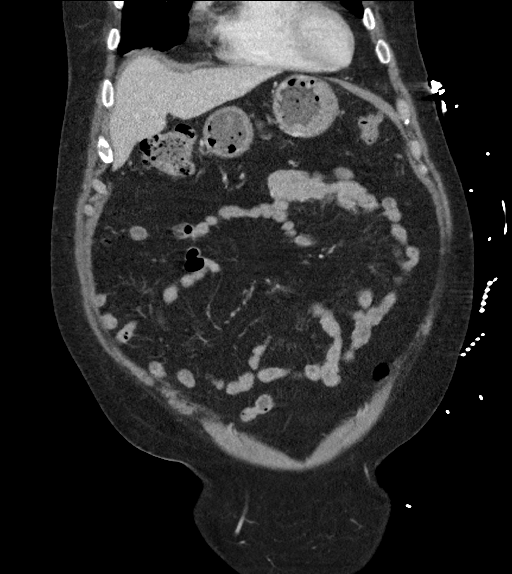
[im 42/95  soft-tissue]
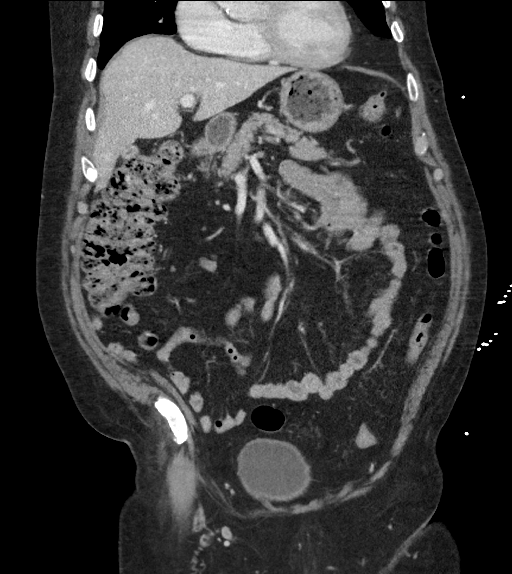
[im 53/95  soft-tissue]
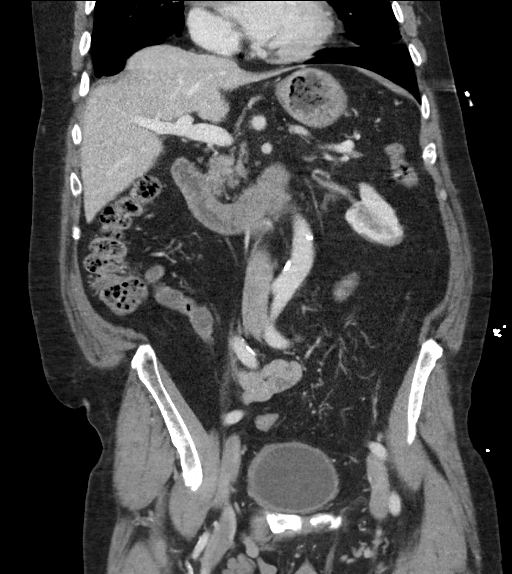

[17 of 46 positions shown; findings below may reference images not displayed]

RADIATION DOSE REDUCTION: This exam was performed according to the
departmental dose-optimization program which includes automated
exposure control, adjustment of the mA and/or kV according to
patient size and/or use of iterative reconstruction technique.

CONTRAST:  100mL OMNIPAQUE IOHEXOL 300 MG/ML  SOLN
FINDINGS: Lower chest: No acute abnormality

Hepatobiliary: No focal hepatic abnormality. Gallbladder
unremarkable.

Pancreas: No focal abnormality or ductal dilatation.

Spleen: No focal abnormality.  Normal size.

Adrenals/Urinary Tract: Small cyst off the lower pole of the right
kidney. No stones or hydronephrosis. Adrenal glands and urinary
bladder unremarkable.

Stomach/Bowel: Normal appendix. Stomach, large and small bowel
grossly unremarkable.

Vascular/Lymphatic: No evidence of aneurysm or adenopathy. Aortic
atherosclerosis.

Reproductive: Prostate enlargement with central calcifications.

Other: No free fluid or free air.

Musculoskeletal: No acute bony abnormality.
IMPRESSION: No acute findings in the abdomen or pelvis.

Aortic atherosclerosis.

Prostate enlargement.

## 2022-10-06 ENCOUNTER — Encounter (HOSPITAL_COMMUNITY): Payer: Self-pay

## 2022-10-06 ENCOUNTER — Emergency Department (HOSPITAL_COMMUNITY): Payer: 59

## 2022-10-06 ENCOUNTER — Inpatient Hospital Stay (HOSPITAL_COMMUNITY)
Admission: EM | Admit: 2022-10-06 | Discharge: 2022-10-11 | DRG: 555 | Disposition: A | Discharge status: Home / Self Care | Payer: 59 | Attending: Internal Medicine | Admitting: Internal Medicine

## 2022-10-06 ENCOUNTER — Other Ambulatory Visit: Payer: Self-pay

## 2022-10-06 DIAGNOSIS — E785 Hyperlipidemia, unspecified: Secondary | ICD-10-CM | POA: Diagnosis present

## 2022-10-06 DIAGNOSIS — Z79899 Other long term (current) drug therapy: Secondary | ICD-10-CM | POA: Diagnosis not present

## 2022-10-06 DIAGNOSIS — Z79891 Long term (current) use of opiate analgesic: Secondary | ICD-10-CM

## 2022-10-06 DIAGNOSIS — N400 Enlarged prostate without lower urinary tract symptoms: Secondary | ICD-10-CM | POA: Diagnosis present

## 2022-10-06 DIAGNOSIS — I7779 Dissection of other artery: Secondary | ICD-10-CM | POA: Diagnosis present

## 2022-10-06 DIAGNOSIS — D62 Acute posthemorrhagic anemia: Secondary | ICD-10-CM | POA: Diagnosis present

## 2022-10-06 DIAGNOSIS — S301XXA Contusion of abdominal wall, initial encounter: Principal | ICD-10-CM | POA: Diagnosis present

## 2022-10-06 DIAGNOSIS — F200 Paranoid schizophrenia: Secondary | ICD-10-CM | POA: Diagnosis present

## 2022-10-06 DIAGNOSIS — K760 Fatty (change of) liver, not elsewhere classified: Secondary | ICD-10-CM | POA: Diagnosis present

## 2022-10-06 DIAGNOSIS — I10 Essential (primary) hypertension: Secondary | ICD-10-CM | POA: Diagnosis present

## 2022-10-06 DIAGNOSIS — E871 Hypo-osmolality and hyponatremia: Secondary | ICD-10-CM | POA: Insufficient documentation

## 2022-10-06 DIAGNOSIS — Z888 Allergy status to other drugs, medicaments and biological substances status: Secondary | ICD-10-CM | POA: Diagnosis not present

## 2022-10-06 DIAGNOSIS — M419 Scoliosis, unspecified: Secondary | ICD-10-CM | POA: Diagnosis present

## 2022-10-06 DIAGNOSIS — Z8249 Family history of ischemic heart disease and other diseases of the circulatory system: Secondary | ICD-10-CM

## 2022-10-06 DIAGNOSIS — Z885 Allergy status to narcotic agent status: Secondary | ICD-10-CM

## 2022-10-06 DIAGNOSIS — K828 Other specified diseases of gallbladder: Secondary | ICD-10-CM | POA: Diagnosis present

## 2022-10-06 DIAGNOSIS — K219 Gastro-esophageal reflux disease without esophagitis: Secondary | ICD-10-CM | POA: Diagnosis present

## 2022-10-06 DIAGNOSIS — E669 Obesity, unspecified: Secondary | ICD-10-CM | POA: Diagnosis present

## 2022-10-06 DIAGNOSIS — F32A Depression, unspecified: Secondary | ICD-10-CM | POA: Diagnosis present

## 2022-10-06 DIAGNOSIS — G894 Chronic pain syndrome: Secondary | ICD-10-CM | POA: Diagnosis present

## 2022-10-06 DIAGNOSIS — M7981 Nontraumatic hematoma of soft tissue: Principal | ICD-10-CM | POA: Diagnosis present

## 2022-10-06 DIAGNOSIS — Z6830 Body mass index (BMI) 30.0-30.9, adult: Secondary | ICD-10-CM | POA: Diagnosis not present

## 2022-10-06 DIAGNOSIS — Z87891 Personal history of nicotine dependence: Secondary | ICD-10-CM | POA: Diagnosis not present

## 2022-10-06 DIAGNOSIS — F418 Other specified anxiety disorders: Secondary | ICD-10-CM | POA: Diagnosis not present

## 2022-10-06 LAB — COMPREHENSIVE METABOLIC PANEL
ALT: 19 U/L (ref 0–44)
AST: 17 U/L (ref 15–41)
Albumin: 3.7 g/dL (ref 3.5–5.0)
Alkaline Phosphatase: 87 U/L (ref 38–126)
Anion gap: 8 (ref 5–15)
BUN: 9 mg/dL (ref 8–23)
CO2: 25 mmol/L (ref 22–32)
Calcium: 8.4 mg/dL — ABNORMAL LOW (ref 8.9–10.3)
Chloride: 97 mmol/L — ABNORMAL LOW (ref 98–111)
Creatinine, Ser: 0.69 mg/dL (ref 0.61–1.24)
GFR, Estimated: >60 mL/min (ref >60)
Glucose, Bld: 114 mg/dL — ABNORMAL HIGH (ref 70–99)
Potassium: 3.7 mmol/L (ref 3.5–5.1)
Sodium: 130 mmol/L — ABNORMAL LOW (ref 135–145)
Total Bilirubin: 0.7 mg/dL (ref 0.3–1.2)
Total Protein: 6.6 g/dL (ref 6.5–8.1)

## 2022-10-06 LAB — CBC WITH DIFFERENTIAL/PLATELET
Abs Immature Granulocytes: 0.05 10*3/uL (ref 0.00–0.07)
Basophils Absolute: 0 10*3/uL (ref 0.0–0.1)
Basophils Relative: 0 %
Eosinophils Absolute: 0 10*3/uL (ref 0.0–0.5)
Eosinophils Relative: 0 %
HCT: 35.5 % — ABNORMAL LOW (ref 39.0–52.0)
Hemoglobin: 12.4 g/dL — ABNORMAL LOW (ref 13.0–17.0)
Immature Granulocytes: 1 %
Lymphocytes Relative: 15 %
Lymphs Abs: 1.2 10*3/uL (ref 0.7–4.0)
MCH: 30.9 pg (ref 26.0–34.0)
MCHC: 34.9 g/dL (ref 30.0–36.0)
MCV: 88.5 fL (ref 80.0–100.0)
Monocytes Absolute: 0.7 10*3/uL (ref 0.1–1.0)
Monocytes Relative: 9 %
Neutro Abs: 6 10*3/uL (ref 1.7–7.7)
Neutrophils Relative %: 75 %
Platelets: 225 10*3/uL (ref 150–400)
RBC: 4.01 MIL/uL — ABNORMAL LOW (ref 4.22–5.81)
RDW: 12.5 % (ref 11.5–15.5)
WBC: 7.9 10*3/uL (ref 4.0–10.5)
nRBC: 0 % (ref 0.0–0.2)

## 2022-10-06 LAB — URINALYSIS, ROUTINE W REFLEX MICROSCOPIC
Bilirubin Urine: NEGATIVE
Glucose, UA: NEGATIVE mg/dL
Hgb urine dipstick: NEGATIVE
Ketones, ur: NEGATIVE mg/dL
Leukocytes,Ua: NEGATIVE
Nitrite: NEGATIVE
Protein, ur: NEGATIVE mg/dL
Specific Gravity, Urine: 1.041 — ABNORMAL HIGH (ref 1.005–1.030)
pH: 7 (ref 5.0–8.0)

## 2022-10-06 LAB — LIPASE, BLOOD: Lipase: 25 U/L (ref 11–51)

## 2022-10-06 LAB — PROTIME-INR
INR: 1 (ref 0.8–1.2)
Prothrombin Time: 13.7 s (ref 11.4–15.2)

## 2022-10-06 LAB — HEMOGLOBIN AND HEMATOCRIT, BLOOD
HCT: 30.1 % — ABNORMAL LOW (ref 39.0–52.0)
Hemoglobin: 10.6 g/dL — ABNORMAL LOW (ref 13.0–17.0)

## 2022-10-06 LAB — ETHANOL: Alcohol, Ethyl (B): <10 mg/dL (ref <10)

## 2022-10-06 MED ORDER — ONDANSETRON HCL 4 MG/2ML IJ SOLN: 4.0000 mg | Freq: Four times a day (QID) | INTRAMUSCULAR | Status: DC | PRN

## 2022-10-06 MED ORDER — HYDROMORPHONE HCL 1 MG/ML IJ SOLN
1.0000 mg | Freq: Once | INTRAMUSCULAR | Status: AC
  Administered 2022-10-06: 1 mg via INTRAVENOUS
  Filled 2022-10-06: qty 1

## 2022-10-06 MED ORDER — ONDANSETRON HCL 4 MG PO TABS: 4.0000 mg | ORAL_TABLET | Freq: Four times a day (QID) | ORAL | Status: DC | PRN

## 2022-10-06 MED ORDER — HYDROMORPHONE HCL 1 MG/ML IJ SOLN
0.5000 mg | Freq: Once | INTRAMUSCULAR | Status: AC
  Administered 2022-10-06: 0.5 mg via INTRAVENOUS
  Filled 2022-10-06: qty 0.5

## 2022-10-06 MED ORDER — ACETAMINOPHEN 650 MG RE SUPP: 650.0000 mg | Freq: Four times a day (QID) | RECTAL | Status: DC | PRN

## 2022-10-06 MED ORDER — ONDANSETRON HCL 4 MG/2ML IJ SOLN
4.0000 mg | Freq: Once | INTRAMUSCULAR | Status: AC
  Administered 2022-10-06: 4 mg via INTRAVENOUS
  Filled 2022-10-06: qty 2

## 2022-10-06 MED ORDER — SODIUM CHLORIDE 0.9 % IV BOLUS
500.0000 mL | Freq: Once | INTRAVENOUS | Status: AC
  Administered 2022-10-06: 500 mL via INTRAVENOUS

## 2022-10-06 MED ORDER — ACETAMINOPHEN 325 MG PO TABS: 650.0000 mg | ORAL_TABLET | Freq: Four times a day (QID) | ORAL | Status: DC | PRN

## 2022-10-06 MED ORDER — SODIUM CHLORIDE 0.9 % IV BOLUS
1000.0000 mL | Freq: Once | INTRAVENOUS | Status: AC
  Administered 2022-10-06: 1000 mL via INTRAVENOUS

## 2022-10-06 MED ORDER — IOHEXOL 300 MG/ML  SOLN
100.0000 mL | Freq: Once | INTRAMUSCULAR | Status: AC | PRN
  Administered 2022-10-06: 100 mL via INTRAVENOUS

## 2022-10-06 MED ORDER — IOHEXOL 350 MG/ML SOLN
60.0000 mL | Freq: Once | INTRAVENOUS | Status: AC | PRN
  Administered 2022-10-06: 60 mL via INTRAVENOUS

## 2022-10-06 NOTE — ED Notes (Signed)
Pt states he is unable to provide urine sample at this time and states that pain is worse now.

## 2022-10-06 NOTE — ED Triage Notes (Signed)
Pt comes by [facility] for abdominal pain upper right quatdrant. States he was dx with hernia 3 yrs ago. RUQ is distended and rigid. Pt has hx of hypertension.

## 2022-10-06 NOTE — ED Provider Notes (Signed)
[place] EMERGENCY DEPARTMENT AT [facility] Provider Note   CSN: [id number] Arrival date & time: 10/06/22  1340     History  Chief Complaint  Patient presents with   Abdominal Pain    Jim Collins is a 72 y.o. male.   Abdominal Pain Associated symptoms: no chills, no cough, no diarrhea, no dysuria, no fever, no nausea, no shortness of breath and no vomiting        Jim Collins is a 72 y.o. male with past medical history of GERD, BPH, schizophrenia, hypertension who presents to the Emergency Department complaining of pain swelling and mass of right upper quadrant.  States symptoms began 2 days ago.  Noticed gradually worsening pain yesterday after drinking coffee.  Today, has significant pain with any movement or attempting to bend over.  He denies worsening pain with food intake.  No nausea vomiting or diarrhea.  He is nonradiating.  Denies any known injury states the area feels swollen and hard.  No flank pain or dysuria.  No history of alcohol use  Home Medications Prior to Admission medications   Medication Sig Start Date End Date Taking? Authorizing Provider  amLODipine (NORVASC) 5 MG tablet Take 5 mg by mouth daily. 04/12/22  Yes [provider]  escitalopram (LEXAPRO) 10 MG tablet Take 10 mg by mouth daily.   Yes [provider]  esomeprazole (NEXIUM) 20 MG capsule Take 20 mg by mouth daily before breakfast.   Yes [provider]  losartan (COZAAR) 100 MG tablet Take 100 mg by mouth daily. 04/12/22  Yes [provider]  OLANZapine (ZYPREXA) 15 MG tablet Take 15 mg by mouth at bedtime. 03/10/20  Yes [provider]  ondansetron (ZOFRAN ODT) 4 MG disintegrating tablet Take 1 tablet (4 mg total) by mouth every 6 (six) hours as needed for nausea or vomiting. 04/04/20  Yes Tiffany Kocher, PA-C  Oxycodone HCl 10 MG TABS Take 10 mg by mouth 3 (three) times daily as needed. 03/17/20  Yes [provider]  pantoprazole  (PROTONIX) 40 MG tablet Take one capsule once to twice daily before a meal 11/11/21  Yes Tiffany Kocher, PA-C  polyethylene glycol (MIRALAX MIX-IN PAX) 17 g packet Take 17 grams (one pack) mixed in 4-6 ounces of liquid twice daily until soft bowel movement, then continue once daily thereafter. 04/04/20  Yes Tiffany Kocher, PA-C  sucralfate (CARAFATE) 1 g tablet Take 1 g by mouth in the morning, at noon, and at bedtime. As needed   Yes [provider]      Allergies    Haloperidol lactate, Morphine and codeine, Compazine [prochlorperazine], and Thorazine [chlorpromazine]    Review of Systems   Review of Systems  Constitutional:  Negative for chills and fever.  Respiratory:  Negative for cough and shortness of breath.   Gastrointestinal:  Positive for abdominal pain. Negative for diarrhea, nausea and vomiting.  Genitourinary:  Negative for dysuria and flank pain.  Musculoskeletal:  Negative for arthralgias and back pain.  Neurological:  Negative for dizziness, weakness, numbness and headaches.    Physical Exam Updated Vital Signs BP 98/69   Pulse 80   Temp (!) 97.5 F (36.4 C) (Oral)   Resp 16   Ht 5\' 6"  (1.676 m)   Wt 84.8 kg   SpO2 94%   BMI 30.18 kg/m  Physical Exam Vitals and nursing note reviewed.  Constitutional:      General: He is not in acute distress.  Appearance: He is well-developed. He is not ill-appearing or toxic-appearing.  HENT:     Mouth/Throat:     Mouth: Mucous membranes are moist.  Cardiovascular:     Rate and Rhythm: Normal rate and regular rhythm.     Pulses: Normal pulses.  Pulmonary:     Effort: Pulmonary effort is normal. No respiratory distress.  Abdominal:     Palpations: There is mass.     Comments: Localized area of swelling and firmness to the right upper quadrant.  Mildly ecchymotic.  Nonmobile remaining abdomen is soft and nontender.  No CVA tenderness  Musculoskeletal:        General: Normal range of motion.  Skin:     General: Skin is warm.     Capillary Refill: Capillary refill takes less than 2 seconds.  Neurological:     General: No focal deficit present.     Mental Status: He is alert.     Sensory: No sensory deficit.     Motor: No weakness.     ED Results / Procedures / Treatments   Labs (all labs ordered are listed, but only abnormal results are displayed) Labs Reviewed  COMPREHENSIVE METABOLIC PANEL - Abnormal; Notable for the following components:      Result Value   Sodium 130 (*)    Chloride 97 (*)    Glucose, Bld 114 (*)    Calcium 8.4 (*)    All other components within normal limits  CBC WITH DIFFERENTIAL/PLATELET - Abnormal; Notable for the following components:   RBC 4.01 (*)    Hemoglobin 12.4 (*)    HCT 35.5 (*)    All other components within normal limits  URINALYSIS, ROUTINE W REFLEX MICROSCOPIC - Abnormal; Notable for the following components:   Specific Gravity, Urine 1.041 (*)    All other components within normal limits  HEMOGLOBIN AND HEMATOCRIT, BLOOD - Abnormal; Notable for the following components:   Hemoglobin 10.6 (*)    HCT 30.1 (*)    All other components within normal limits  LIPASE, BLOOD  ETHANOL  PROTIME-INR  HEMOGLOBIN AND HEMATOCRIT, BLOOD  BASIC METABOLIC PANEL  OSMOLALITY  OSMOLALITY, URINE  SODIUM, URINE, RANDOM  COMPREHENSIVE METABOLIC PANEL  CBC  MAGNESIUM  PHOSPHORUS    EKG None  Radiology CT Angio Abdomen W and/or Wo Contrast  Result Date: 10/06/2022 CLINICAL DATA:  Hematoma, rectus sheath extravasation. CTA recommended. EXAM: CT ANGIOGRAPHY ABDOMEN TECHNIQUE: Multidetector CT imaging of the abdomen was performed using the standard protocol during bolus administration of intravenous contrast. Multiplanar reconstructed images and MIPs were obtained and reviewed to evaluate the vascular anatomy. RADIATION DOSE REDUCTION: This exam was performed according to the departmental dose-optimization program which includes automated exposure  control, adjustment of the mA and/or kV according to patient size and/or use of iterative reconstruction technique. CONTRAST:  60mL OMNIPAQUE IOHEXOL 350 MG/ML SOLN COMPARISON:  CT abdomen and pelvis 10/06/2022 FINDINGS: VASCULAR Aorta: Heterotopic 5 atherosclerotic plaque without hemodynamically significant stenosis, aneurysm, or dissection. Celiac: Calcified plaque causes mild narrowing at the origin. 5 mm thin intimal flap in the proximal celiac axis (series 19/image 56) with mild downstream dilation measuring up to 10 mm. This is unchanged from 08/31/2019. SMA: Patent without hemodynamically significant stenosis, aneurysm, or dissection. Renals: Both renal arteries are patent without evidence of aneurysm, dissection, vasculitis, fibromuscular dysplasia or significant stenosis. IMA: Patent. Inflow: Patent where visualized without aneurysm or dissection. Veins: IVC and portal veins are patent. Other: Redemonstrated large right-sided rectus sheath hematoma. This is  similar in size measuring 17.1 x 7.3 x 15.4 cm, previously 15.3 x 7.9 x 15.4 cm. The previous active contrast extravasation is no longer visualized. There is no significant change between noncontrast, arterial, and venous phase imaging. The right epigastric artery is seen at the superior and inferior portions of the hematoma but is not well visualized within the hematoma itself. Subcutaneous fat stranding/edema anterior to the hematoma. Review of the MIP images confirms the above findings. NON-VASCULAR Lower chest: No acute abnormality. Hepatobiliary: No acute abnormality. Pancreas: Unremarkable. Spleen: Unremarkable. Adrenals/Urinary Tract: No acute abnormality. Stomach/Bowel: No acute abnormality. Lymphatic: No lymphadenopathy. Other: No free intraperitoneal fluid or air is visualized. Musculoskeletal: No fracture. IMPRESSION: 1. No significant change in size of the large right-sided rectus sheath hematoma since CT earlier today. The previous active  contrast extravasation has resolved. 2. 5 mm thin intimal flap in the proximal celiac axis with mild downstream dilation measuring up to 10 mm. 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 Aortic Atherosclerosis (ICD10-I70.0). Electronically Signed   By: Minerva Fester M.D.   On: 10/06/2022 19:59   CT ABDOMEN PELVIS W CONTRAST  Addendum Date: 10/06/2022   ADDENDUM REPORT: 10/06/2022 17:39 ADDENDUM: Of note in terms of the rectus sheath hematoma, on delayed imaging the level of active extravasation within the hematoma is quite large. Clear increase in pooling of contrast increasing from standard delayed imaging. Electronically Signed   By: Karen Kays M.D.   On: 10/06/2022 17:39   Result Date: 10/06/2022 CLINICAL DATA:  Right lower quadrant abdominal pain and possible mass. Hernia. EXAM: CT ABDOMEN AND PELVIS WITH CONTRAST TECHNIQUE: Multidetector CT imaging of the abdomen and pelvis was performed using the standard protocol following bolus administration of intravenous contrast. RADIATION DOSE REDUCTION: This exam was performed according to the departmental dose-optimization program which includes automated exposure control, adjustment of the mA and/or kV according to patient size and/or use of iterative reconstruction technique. CONTRAST:  OMNIPAQUE IOHEXOL 300 MG/ML  SOLN COMPARISON:  CT 06/08/2021.  Ultrasound earlier 10/06/2022 FINDINGS: Lower chest: there is some linear opacity lung bases likely scar or atelectasis. No pleural effusion. Calcified nodule at the medial right lower lobe on series 4, image 3 consistent with old granulomatous disease. Small hiatal hernia. Hepatobiliary: No focal liver abnormality is seen. No gallstones, gallbladder wall thickening, or biliary dilatation. Patent portal vein. Pancreas: Unremarkable. No pancreatic ductal dilatation or surrounding inflammatory changes. Spleen: Normal in size without focal abnormality. Adrenals/Urinary  Tract: Adrenal glands are preserved. No enhancing renal mass or collecting system dilatation. The ureters have normal course and caliber extending down to the bladder. 17 mm cyst seen along lower pole of the right kidney, Bosniak 1 lesion. Some tiny foci elsewhere, Bosniak 2. No imaging follow up. Preserved contours of the urinary bladder. There is wall thickening and some trabeculation. Prominent prostate with mass effect along the base of the bladder. Stomach/Bowel: On this non oral contrast exam, the large bowel has a normal course and caliber with scattered moderate colonic stool. Normal retrocecal appendix. The stomach and small bowel are nondilated. Scattered luminal debris along the distal small bowel. Vascular/Lymphatic: Aortic atherosclerosis. No enlarged abdominal or pelvic lymph nodes. Several collateral vessels along the right inguinal region. Reproductive: Enlarged prostate with mass effect along the base of the bladder. Please correlate with BPH symptoms in the patient's PSA Other: No free intra-air or free fluid. Musculoskeletal: Scattered degenerative changes along the spine. Scattered multilevel disc bulge. Mild degenerative changes  along the pelvis. There is a large rectus sheath hematoma on the right side measuring 15.3 x 7.9 by 15.5 cm. There are areas of active extravasation of contrast within the hematoma. Critical Value/emergent results were called by telephone at the time of interpretation on 10/06/2022 at 2:11 pm to provider [facility] Jenice Leiner , who verbally acknowledged these results. IMPRESSION: Large right-sided rectus sheath hematoma measuring up to 15.5 cm with a active extravasation of contrast, active hemorrhage. Please correlate with patient's clinical presentation and further evaluation. No additional areas of hematoma or fluid collections in the peritoneal cavity. No bowel obstruction. Normal appendix. Enlarged prostate with mass effect along the bladder. Please correlate with patient's  PSA Electronically Signed: By: Karen Kays M.D. On: 10/06/2022 17:14   US Abdomen Limited  Result Date: 10/06/2022 CLINICAL DATA:  Right upper quadrant pain EXAM: ULTRASOUND ABDOMEN LIMITED RIGHT UPPER QUADRANT COMPARISON:  Ultrasound 12/18/2019.  CT 06/08/2021 FINDINGS: Gallbladder: Gallbladder is underdistended.  No obvious stones. Common bile duct: Diameter: 3 mm Liver: Diffusely echogenic hepatic parenchyma consistent with fatty liver infiltration. With this level of echogenicity evaluation for underlying mass lesion is limited and if needed follow up contrast CT or MRI as clinically appropriate. Portal vein is patent on color Doppler imaging with normal direction of blood flow towards the liver. Other: None. IMPRESSION: Underdistended gallbladder. No obvious stones. No ductal dilatation. Fatty liver infiltration Electronically Signed   By: Karen Kays M.D.   On: 10/06/2022 15:18    Procedures Procedures    Medications Ordered in ED Medications  acetaminophen (TYLENOL) tablet 650 mg (has no administration in time range)    Or  acetaminophen (TYLENOL) suppository 650 mg (has no administration in time range)  ondansetron (ZOFRAN) tablet 4 mg (has no administration in time range)    Or  ondansetron (ZOFRAN) injection 4 mg (has no administration in time range)  HYDROmorphone (DILAUDID) injection 0.5 mg (0.5 mg Intravenous Given 10/06/22 1512)  ondansetron (ZOFRAN) injection 4 mg (4 mg Intravenous Given 10/06/22 1510)  iohexol (OMNIPAQUE) 300 MG/ML solution 100 mL (100 mLs Intravenous Contrast Given 10/06/22 1547)  HYDROmorphone (DILAUDID) injection 1 mg (1 mg Intravenous Given 10/06/22 1639)  sodium chloride 0.9 % bolus 1,000 mL (0 mLs Intravenous Stopped 10/06/22 2238)  iohexol (OMNIPAQUE) 350 MG/ML injection 60 mL (60 mLs Intravenous Contrast Given 10/06/22 1911)  sodium chloride 0.9 % bolus 500 mL (500 mLs Intravenous New Bag/Given 10/06/22 2238)    ED Course/ Medical Decision Making/ A&P                                 Medical Decision Making Patient here from home for evaluation of pain swelling and firmness of right upper quadrant.  Noticed 2 days ago.  Gradually worsening.  Has pain that worsens with movement and with bending over.  Pain is not affected by food intake no dysuria symptoms nausea vomiting or fever or chills.  Recent trauma or prior abdominal surgeries.  Patient has a localized area of firmness at the right upper quadrant on his abdominal exam.  Area is very tender to palpation.  Remaining abdomen is benign.  No history of cirrhosis, denies alcohol use also denies any trauma.  Etiology of this pain and swelling is unclear at this time.  May represent hematoma, gallbladder disease, cirrhosis, acute surgical abdomen.  Amount and/or Complexity of Data Reviewed Labs: ordered.    Details: Labs interpreted by me, no evidence  of leukocytosis, hemoglobin 12.4 no recent values available for comparison chemistries show hyponatremia with sodium of 130, otherwise unremarkable, lipase reassuring.  Blood alcohol less than 10 Radiology: ordered.    Details: Ultrasound ordered for further evaluation, shows under distended gallbladder with no obvious stones or ductal dilatation.  CT abdomen and pelvis shows large 15 cm rectus sheath hematoma with active extravasation Discussion of management or test interpretation with external provider(s): Discussed findings with general surgery, Dr. Robyne Peers, who recommends consult with IR.  This is nonoperative condition as condition is usually self-limiting.  Will consult with IR  Consulted with IR at [facility], Dr. Loreta Ave and discussed findings.  Recommends CTA abdomen for further evaluation of extravasation.  Recommends patient be transferred to [facility] for further management which may include embolization versus observation  CTA abdomen and pelvis was performed, further evaluation of extravasation which appears to have resolved on CTA  abd  During ER course patient did require IV pain medication.  He had episode of hypotension felt to be secondary to narcotic pain medication.  Patient was given IV fluids and had improvement of his pressure at time of dictation pressure 105/60  Discussed findings with [facility] hospitalist, Dr. Thomes Dinning who agrees to admit and arrange for patient to be transferred to [facility]   Risk Prescription drug management.           Final Clinical Impression(s) / ED Diagnoses Final diagnoses:  Abdominal wall hematoma, initial encounter    Rx / DC Orders ED Discharge Orders     None         Pauline Aus, PA-C 10/06/22 2345    Terrilee Files, MD 10/07/22 1055
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
## 2022-10-06 NOTE — ED Notes (Signed)
Pt asked for me to contact Fleet Contras to let her know he is here. Contacted her and she said to let her know when he is going to be discharged as she will be the one to pick him up. Number is [phone]

## 2022-10-06 NOTE — Progress Notes (Addendum)
Interventional Radiology Progress Note  72 yo male presents to the [facility] ED with RUQ pain.   CT + contrast shows rectus hematoma.  Evidence of extrav, though the timing is not CTA.   [facility] contacted regarding help with managing, possible embo. Discussed with ED regarding imaging and management.   Currently patient normal vitals.  He is reported not to take any AC.   -Agree best location for observation is [facility].  Can be ED to ED or to inpt transfer.  -Recommend CTA on arrival to [facility] to attempt identify location of hemorrhage for any needed embo attempt.  -Symptom control, including ice prn - serial H&H - rectus hematoma/closed space expected to cease -VIR available to review - Contact [facility]IR if the patient becomes unstable.   Signed,  Yvone Neu. Loreta Ave, DO, ABVM, RPVI

## 2022-10-06 NOTE — ED Provider Notes (Incomplete)
[place] [facility] AT [facility] Provider Note   CSN: [id number] Arrival date & time: 10/06/22  1340     History {Add pertinent medical, surgical, social history, OB history to HPI:1} Chief Complaint  Patient presents with  . Abdominal Pain    Jim Collins is a 72 y.o. male.   Abdominal Pain Associated symptoms: no chills, no cough, no diarrhea, no dysuria, no fever, no nausea, no shortness of breath and no vomiting        Jim Collins is a 72 y.o. male with past medical history of GERD, BPH, schizophrenia, hypertension who presents to the Emergency Department complaining of pain swelling and mass of right upper quadrant.  States symptoms began 2 days ago.  Noticed gradually worsening pain yesterday after drinking coffee.  Today, has significant pain with any movement or attempting to bend over.  He denies worsening pain with food intake.  No nausea vomiting or diarrhea.  He is nonradiating.  Denies any known injury states the area feels swollen and hard.  No flank pain or dysuria.  No history of alcohol use  Home Medications Prior to Admission medications   Medication Sig Start Date End Date Taking? Authorizing Provider  losartan (COZAAR) 50 MG tablet Take 50 mg by mouth daily. 03/21/20   [provider]  OLANZapine (ZYPREXA) 15 MG tablet Take 15 mg by mouth at bedtime. 03/10/20   [provider]  ondansetron (ZOFRAN ODT) 4 MG disintegrating tablet Take 1 tablet (4 mg total) by mouth every 6 (six) hours as needed for nausea or vomiting. 04/04/20   Tiffany Kocher, PA-C  Oxycodone HCl 10 MG TABS Take 10 mg by mouth 3 (three) times daily as needed. 03/17/20   [provider]  pantoprazole (PROTONIX) 40 MG tablet Take one capsule once to twice daily before a meal 11/11/21   Tiffany Kocher, PA-C  polyethylene glycol (MIRALAX MIX-IN PAX) 17 g packet Take 17 grams (one pack) mixed in 4-6 ounces of liquid twice daily until soft bowel movement,  then continue once daily thereafter. 04/04/20   Tiffany Kocher, PA-C  sucralfate (CARAFATE) 1 g tablet Take 1 g by mouth in the morning, at noon, and at bedtime. As needed    [provider]      Allergies    Haloperidol lactate, Morphine and codeine, Compazine [prochlorperazine], and Thorazine [chlorpromazine]    Review of Systems   Review of Systems  Constitutional:  Negative for chills and fever.  Respiratory:  Negative for cough and shortness of breath.   Gastrointestinal:  Positive for abdominal pain. Negative for diarrhea, nausea and vomiting.  Genitourinary:  Negative for dysuria and flank pain.  Musculoskeletal:  Negative for arthralgias and back pain.  Neurological:  Negative for dizziness, weakness, numbness and headaches.    Physical Exam Updated Vital Signs BP 122/80 (BP Location: Right Arm)   Pulse 80   Temp 98 F (36.7 C) (Oral)   Resp 20   Ht 5\' 6"  (1.676 m)   Wt 84.8 kg   SpO2 96%   BMI 30.18 kg/m  Physical Exam Vitals and nursing note reviewed.  Constitutional:      General: He is not in acute distress.    Appearance: He is well-developed. He is not ill-appearing or toxic-appearing.  HENT:     Mouth/Throat:     Mouth: Mucous membranes are moist.  Cardiovascular:     Rate and Rhythm: Normal rate and regular rhythm.  Pulses: Normal pulses.  Pulmonary:     Effort: Pulmonary effort is normal. No respiratory distress.  Abdominal:     Palpations: There is mass.     Comments: Localized area of swelling and firmness to the right upper quadrant.  Mildly ecchymotic.  Nonmobile remaining abdomen is soft and nontender.  No CVA tenderness  Musculoskeletal:        General: Normal range of motion.  Skin:    General: Skin is warm.     Capillary Refill: Capillary refill takes less than 2 seconds.  Neurological:     General: No focal deficit present.     Mental Status: He is alert.     Sensory: No sensory deficit.     Motor: No weakness.     ED  Results / Procedures / Treatments   Labs (all labs ordered are listed, but only abnormal results are displayed) Labs Reviewed  COMPREHENSIVE METABOLIC PANEL - Abnormal; Notable for the following components:      Result Value   Sodium 130 (*)    Chloride 97 (*)    Glucose, Bld 114 (*)    Calcium 8.4 (*)    All other components within normal limits  CBC WITH DIFFERENTIAL/PLATELET - Abnormal; Notable for the following components:   RBC 4.01 (*)    Hemoglobin 12.4 (*)    HCT 35.5 (*)    All other components within normal limits  LIPASE, BLOOD  ETHANOL  URINALYSIS, ROUTINE W REFLEX MICROSCOPIC  PROTIME-INR    EKG None  Radiology CT ABDOMEN PELVIS W CONTRAST  Result Date: 10/06/2022 CLINICAL DATA:  Right lower quadrant abdominal pain and possible mass. Hernia. EXAM: CT ABDOMEN AND PELVIS WITH CONTRAST TECHNIQUE: Multidetector CT imaging of the abdomen and pelvis was performed using the standard protocol following bolus administration of intravenous contrast. RADIATION DOSE REDUCTION: This exam was performed according to the departmental dose-optimization program which includes automated exposure control, adjustment of the mA and/or kV according to patient size and/or use of iterative reconstruction technique. CONTRAST:  OMNIPAQUE IOHEXOL 300 MG/ML  SOLN COMPARISON:  CT 06/08/2021.  Ultrasound earlier 10/06/2022 FINDINGS: Lower chest: there is some linear opacity lung bases likely scar or atelectasis. No pleural effusion. Calcified nodule at the medial right lower lobe on series 4, image 3 consistent with old granulomatous disease. Small hiatal hernia. Hepatobiliary: No focal liver abnormality is seen. No gallstones, gallbladder wall thickening, or biliary dilatation. Patent portal vein. Pancreas: Unremarkable. No pancreatic ductal dilatation or surrounding inflammatory changes. Spleen: Normal in size without focal abnormality. Adrenals/Urinary Tract: Adrenal glands are preserved. No  enhancing renal mass or collecting system dilatation. The ureters have normal course and caliber extending down to the bladder. 17 mm cyst seen along lower pole of the right kidney, Bosniak 1 lesion. Some tiny foci elsewhere, Bosniak 2. No imaging follow up. Preserved contours of the urinary bladder. There is wall thickening and some trabeculation. Prominent prostate with mass effect along the base of the bladder. Stomach/Bowel: On this non oral contrast exam, the large bowel has a normal course and caliber with scattered moderate colonic stool. Normal retrocecal appendix. The stomach and small bowel are nondilated. Scattered luminal debris along the distal small bowel. Vascular/Lymphatic: Aortic atherosclerosis. No enlarged abdominal or pelvic lymph nodes. Several collateral vessels along the right inguinal region. Reproductive: Enlarged prostate with mass effect along the base of the bladder. Please correlate with BPH symptoms in the patient's PSA Other: No free intra-air or free fluid. Musculoskeletal: Scattered degenerative  changes along the spine. Scattered multilevel disc bulge. Mild degenerative changes along the pelvis. There is a large rectus sheath hematoma on the right side measuring 15.3 x 7.9 by 15.5 cm. There are areas of active extravasation of contrast within the hematoma. Critical Value/emergent results were called by telephone at the time of interpretation on 10/06/2022 at 2:11 pm to provider [facility] Niklas Chretien , who verbally acknowledged these results. IMPRESSION: Large right-sided rectus sheath hematoma measuring up to 15.5 cm with a active extravasation of contrast, active hemorrhage. Please correlate with patient's clinical presentation and further evaluation. No additional areas of hematoma or fluid collections in the peritoneal cavity. No bowel obstruction. Normal appendix. Enlarged prostate with mass effect along the bladder. Please correlate with patient's PSA Electronically Signed   By: Karen Kays M.D.   On: 10/06/2022 17:14   US Abdomen Limited  Result Date: 10/06/2022 CLINICAL DATA:  Right upper quadrant pain EXAM: ULTRASOUND ABDOMEN LIMITED RIGHT UPPER QUADRANT COMPARISON:  Ultrasound 12/18/2019.  CT 06/08/2021 FINDINGS: Gallbladder: Gallbladder is underdistended.  No obvious stones. Common bile duct: Diameter: 3 mm Liver: Diffusely echogenic hepatic parenchyma consistent with fatty liver infiltration. With this level of echogenicity evaluation for underlying mass lesion is limited and if needed follow up contrast CT or MRI as clinically appropriate. Portal vein is patent on color Doppler imaging with normal direction of blood flow towards the liver. Other: None. IMPRESSION: Underdistended gallbladder. No obvious stones. No ductal dilatation. Fatty liver infiltration Electronically Signed   By: Karen Kays M.D.   On: 10/06/2022 15:18    Procedures Procedures  {Document cardiac monitor, telemetry assessment procedure when appropriate:1}  Medications Ordered in ED Medications  HYDROmorphone (DILAUDID) injection 0.5 mg (0.5 mg Intravenous Given 10/06/22 1512)  ondansetron (ZOFRAN) injection 4 mg (4 mg Intravenous Given 10/06/22 1510)  iohexol (OMNIPAQUE) 300 MG/ML solution 100 mL (100 mLs Intravenous Contrast Given 10/06/22 1547)  HYDROmorphone (DILAUDID) injection 1 mg (1 mg Intravenous Given 10/06/22 1639)    ED Course/ Medical Decision Making/ A&P   {   Click here for ABCD2, HEART and other calculatorsREFRESH Note before signing :1}                              Medical Decision Making Patient here from home for evaluation of pain swelling and firmness of right upper quadrant.  Noticed 2 days ago.  Gradually worsening.  Has pain that worsens with movement and with bending over.  Pain is not affected by food intake no dysuria symptoms nausea vomiting or fever or chills.  Recent trauma or prior abdominal surgeries.  Patient has a localized area of firmness at the right upper quadrant  on his abdominal exam.  Area is very tender to palpation.  Remaining abdomen is benign.  No history of cirrhosis, denies alcohol use also denies any trauma.  Etiology of this pain and swelling is unclear at this time.  May represent hematoma, gallbladder disease, cirrhosis, acute surgical abdomen.  Amount and/or Complexity of Data Reviewed Labs: ordered.    Details: Labs interpreted by me, no evidence of leukocytosis, hemoglobin 12.4 no recent values available for comparison chemistries show hyponatremia with sodium of 130, otherwise unremarkable, lipase reassuring.  Blood alcohol less than 10 Radiology: ordered.    Details: Ultrasound ordered for further evaluation, shows under distended gallbladder with no obvious stones or ductal dilatation.  CT abdomen and pelvis shows large 15 cm rectus sheath hematoma with active  extravasation Discussion of management or test interpretation with external provider(s): Discussed findings with general surgery, Dr. Robyne Peers, who recommends consult with IR.  This is nonoperative condition as condition is usually self-limiting.  Will consult with IR  Consulted with IR at [facility], Dr. Loreta Ave and discussed findings.  Recommends CTA abdomen for further evaluation of extravasation.  Recommends patient be transferred to [facility] for further management which may include embolization versus observation  Risk Prescription drug management.     {Document critical care time when appropriate:1} {Document review of labs and clinical decision tools ie heart score, Chads2Vasc2 etc:1}  {Document your independent review of radiology images, and any outside records:1} {Document your discussion with family members, caretakers, and with consultants:1} {Document social determinants of health affecting pt's care:1} {Document your decision making why or why not admission, treatments were needed:1} Final Clinical Impression(s) / ED Diagnoses Final diagnoses:  None     Rx / DC Orders ED Discharge Orders     None

## 2022-10-06 NOTE — ED Notes (Signed)
Pt speaks [place] and [place]. Offered to get the interpretor but pt refused it. Told pt if it gets to a point where he doesn't understand something then we will get the interpretor  -

## 2022-10-06 NOTE — ED Notes (Signed)
Spoke with Fleet Contras who asked to be notified as soon as we find out any info on pt's surgery taking place. She is also his POA and her number to call is [phone]
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
## 2022-10-06 NOTE — Progress Notes (Signed)
[facility]  Called by ED provider regarding patient.  He presented with right upper quadrant pain with a palpable hard mass.  Abdominal ultrasound was within normal limits, however CT of the abdomen and pelvis is demonstrating a large right-sided rectus sheath hematoma measuring 15.3 x 7.9 x 15.5 cm, with an area of active extravasation of contrast within the hematoma.  Per the patient, he is not on any blood thinning medications, and denies any trauma to the area.  Patient is hemodynamically stable with a normal heart rate and blood pressure.  CBC is demonstrating a hemoglobin of 12.4, compared to 14.4 1-year ago.  PT and INR are within normal limits.  Given that the patient has concern for active extravasation within this rectus sheath hematoma, I recommend consultation of IR for possible intervention.  Patient will require admission to the hospital for clinical monitoring.  Would recommend pain control, ice to the area, and abdominal binder to provide compression.  Will also need to hemodynamically monitor the patient for need for possible transfusion.  Would only recommend surgical interventions if he fails conservative management.  Please call with any questions or concerns.  Theophilus Kinds, DO [facility] [address]ella Raring [place], [place] [address] [phone] (office)

## 2022-10-07 ENCOUNTER — Encounter (HOSPITAL_COMMUNITY): Payer: Self-pay | Admitting: Internal Medicine

## 2022-10-07 DIAGNOSIS — S301XXA Contusion of abdominal wall, initial encounter: Secondary | ICD-10-CM | POA: Diagnosis not present

## 2022-10-07 LAB — COMPREHENSIVE METABOLIC PANEL
ALT: 18 U/L (ref 0–44)
AST: 15 U/L (ref 15–41)
Albumin: 3.2 g/dL — ABNORMAL LOW (ref 3.5–5.0)
Alkaline Phosphatase: 69 U/L (ref 38–126)
Anion gap: 12 (ref 5–15)
BUN: 7 mg/dL — ABNORMAL LOW (ref 8–23)
CO2: 24 mmol/L (ref 22–32)
Calcium: 8 mg/dL — ABNORMAL LOW (ref 8.9–10.3)
Chloride: 99 mmol/L (ref 98–111)
Creatinine, Ser: 0.75 mg/dL (ref 0.61–1.24)
GFR, Estimated: >60 mL/min (ref >60)
Glucose, Bld: 107 mg/dL — ABNORMAL HIGH (ref 70–99)
Potassium: 3.8 mmol/L (ref 3.5–5.1)
Sodium: 135 mmol/L (ref 135–145)
Total Bilirubin: 1 mg/dL (ref 0.3–1.2)
Total Protein: 5.6 g/dL — ABNORMAL LOW (ref 6.5–8.1)

## 2022-10-07 LAB — CBC
HCT: 28.4 % — ABNORMAL LOW (ref 39.0–52.0)
Hemoglobin: 9.9 g/dL — ABNORMAL LOW (ref 13.0–17.0)
MCH: 31.1 pg (ref 26.0–34.0)
MCHC: 34.9 g/dL (ref 30.0–36.0)
MCV: 89.3 fL (ref 80.0–100.0)
Platelets: 199 10*3/uL (ref 150–400)
RBC: 3.18 MIL/uL — ABNORMAL LOW (ref 4.22–5.81)
RDW: 12.6 % (ref 11.5–15.5)
WBC: 9.4 10*3/uL (ref 4.0–10.5)
nRBC: 0 % (ref 0.0–0.2)

## 2022-10-07 LAB — BASIC METABOLIC PANEL
Anion gap: 7 (ref 5–15)
BUN: 8 mg/dL (ref 8–23)
CO2: 22 mmol/L (ref 22–32)
Calcium: 7.6 mg/dL — ABNORMAL LOW (ref 8.9–10.3)
Chloride: 102 mmol/L (ref 98–111)
Creatinine, Ser: 0.71 mg/dL (ref 0.61–1.24)
GFR, Estimated: >60 mL/min (ref >60)
Glucose, Bld: 119 mg/dL — ABNORMAL HIGH (ref 70–99)
Potassium: 4.1 mmol/L (ref 3.5–5.1)
Sodium: 131 mmol/L — ABNORMAL LOW (ref 135–145)

## 2022-10-07 LAB — PHOSPHORUS: Phosphorus: 2.8 mg/dL (ref 2.5–4.6)

## 2022-10-07 LAB — HEMOGLOBIN AND HEMATOCRIT, BLOOD
HCT: 29.8 % — ABNORMAL LOW (ref 39.0–52.0)
Hemoglobin: 10.3 g/dL — ABNORMAL LOW (ref 13.0–17.0)

## 2022-10-07 LAB — OSMOLALITY: Osmolality: 281 mosm/kg (ref 275–295)

## 2022-10-07 LAB — OSMOLALITY, URINE: Osmolality, Ur: 573 mosm/kg (ref 300–900)

## 2022-10-07 LAB — SODIUM, URINE, RANDOM: Sodium, Ur: 62 mmol/L

## 2022-10-07 LAB — MAGNESIUM: Magnesium: 1.9 mg/dL (ref 1.7–2.4)

## 2022-10-07 MED ORDER — LOSARTAN POTASSIUM 50 MG PO TABS
100.0000 mg | ORAL_TABLET | Freq: Every day | ORAL | Status: DC
  Filled 2022-10-07: qty 2

## 2022-10-07 MED ORDER — SODIUM CHLORIDE 0.9 % IV SOLN: INTRAVENOUS | Status: DC

## 2022-10-07 MED ORDER — OXYCODONE HCL 5 MG PO TABS
5.0000 mg | ORAL_TABLET | ORAL | Status: AC | PRN
  Administered 2022-10-07 (×3): 5 mg via ORAL
  Filled 2022-10-07 (×3): qty 1

## 2022-10-07 MED ORDER — ESCITALOPRAM OXALATE 10 MG PO TABS
10.0000 mg | ORAL_TABLET | Freq: Every day | ORAL | Status: DC
  Administered 2022-10-07 – 2022-10-11 (×5): 10 mg via ORAL
  Filled 2022-10-07 (×5): qty 1

## 2022-10-07 MED ORDER — OLANZAPINE 5 MG PO TABS
15.0000 mg | ORAL_TABLET | Freq: Every day | ORAL | Status: DC
  Administered 2022-10-07 – 2022-10-10 (×4): 15 mg via ORAL
  Filled 2022-10-07 (×6): qty 1

## 2022-10-07 MED ORDER — FENTANYL CITRATE PF 50 MCG/ML IJ SOSY
25.0000 ug | PREFILLED_SYRINGE | INTRAMUSCULAR | Status: DC | PRN
  Administered 2022-10-07: 25 ug via INTRAVENOUS
  Filled 2022-10-07: qty 1

## 2022-10-07 MED ORDER — PANTOPRAZOLE SODIUM 40 MG IV SOLR
40.0000 mg | Freq: Every day | INTRAVENOUS | Status: DC
  Administered 2022-10-07: 40 mg via INTRAVENOUS
  Filled 2022-10-07 (×2): qty 10

## 2022-10-07 MED ORDER — ORAL CARE MOUTH RINSE: 15.0000 mL | OROMUCOSAL | Status: DC | PRN
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
## 2022-10-07 NOTE — Plan of Care (Signed)
Patient alert/oriented X4. Patient compliant with medication administration and tolerated clear liquids. Patient administered oxycodone as needed for pain and tolerating IV cont. Fluids. VSS.   Problem: Education: Goal: Knowledge of General Education information will improve Description: Including pain rating scale, medication(s)/side effects and non-pharmacologic comfort measures Outcome: Progressing   Problem: Health Behavior/Discharge Planning: Goal: Ability to manage health-related needs will improve Outcome: Progressing   Problem: Clinical Measurements: Goal: Ability to maintain clinical measurements within normal limits will improve Outcome: Progressing   Problem: Clinical Measurements: Goal: Will remain free from infection Outcome: Progressing   Problem: Clinical Measurements: Goal: Diagnostic test results will improve Outcome: Progressing   Problem: Clinical Measurements: Goal: Respiratory complications will improve Outcome: Progressing   Problem: Clinical Measurements: Goal: Cardiovascular complication will be avoided Outcome: Progressing   Problem: Activity: Goal: Risk for activity intolerance will decrease Outcome: Progressing   Problem: Nutrition: Goal: Adequate nutrition will be maintained Outcome: Progressing   Problem: Coping: Goal: Level of anxiety will decrease Outcome: Progressing   Problem: Elimination: Goal: Will not experience complications related to bowel motility Outcome: Progressing   Problem: Elimination: Goal: Will not experience complications related to urinary retention Outcome: Progressing   Problem: Pain Managment: Goal: General experience of comfort will improve Outcome: Progressing   Problem: Safety: Goal: Ability to remain free from injury will improve Outcome: Progressing   Problem: Skin Integrity: Goal: Risk for impaired skin integrity will decrease Outcome: Progressing

## 2022-10-07 NOTE — Progress Notes (Signed)
Check on pt needs, show him how to use tv , RN will call me once pt is allow to eat by [employer] Leav[employer].

## 2022-10-07 NOTE — Plan of Care (Signed)
  Checking on patient's status.  Vitals:   10/07/22 0710 10/07/22 1200  BP: 105/62 121/78  Pulse: 84 87  Resp: 18 17  Temp: 98.3 F (36.8 C) 98.4 F (36.9 C)  SpO2: 96% 98%   Lab Results  Component Value Date   HGB 9.9 (L) 10/07/2022    Discussed with RN and with IR MD.  Patient remains stable.  Will give clear liquid diet.  If he remains stable overnight, can advance diet to regular.   Jerry Caras Naylee Frankowski PA-C 10/07/2022 3:58 PM
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
## 2022-10-07 NOTE — Progress Notes (Signed)
PROGRESS NOTE    Jim Collins  WUJ:[id number] DOB: 1950-08-15 DOA: 10/06/2022 PCP: Jason Coop, FNP   Brief Narrative:  72 y.o. male with medical history significant of hypertension, GERD, schizophrenia, depression with anxiety presented with worsening abdominal pain.  On presentation, sodium was 130, lipase 25.  Right upper quadrant ultrasound showed no obvious stones or biliary ductal dilatation; showed fatty liver infiltration. CT abdomen and pelvis with contrast showed large right-sided rectus sheath hematoma measuring up to 15.5 cm with an active extravasation of contrast, active hemorrhage. CT angiography of abdomen showed no significant change in size of the large right-sided rectus sheath hematoma since CT earlier today. The previous active contrast extravasation has resolved.  General surgery was consulted at [facility] who recommended IR consultation.  IR recommended to transfer the patient to [facility].  Assessment & Plan:   Rectus sheath hematoma -Questionable cause.  Imaging as above. -IR consultation pending.  Continue pain management.  Continue n.p.o. for now.  Acute blood loss anemia/normocytic anemia -Hemoglobin has slightly dropped.  Currently stable.  Monitor.  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  I have spoken to Dr. Laren Everts surgery.  He will see the patient in consultation.  Hyponatremia -Sodium pending this morning.  Monitor.  Start normal saline at 75 cc an hour.  Essential hypertension -Blood pressure on the lower side.  Hold losartan.  Schizophrenia/depression with anxiety -Continue escitalopram and olanzapine  GERD -Continue Protonix  Obesity -Outpatient follow-up   DVT prophylaxis: SCDs Code Status: Full Family Communication: Daughter-in-law at bedside Disposition Plan: Status is: Inpatient Remains inpatient appropriate because: Of severity of illness    Consultants: IR/vascular  surgery  Procedures: None  Antimicrobials: None   Subjective: Patient seen and examined at bedside.  Daughter present at bedside translates for the patient.  Complains of intermittent abdominal pain.  Denies any current nausea or vomiting.  No fever or shortness of breath reported.  Objective: Vitals:   10/07/22 0036 10/07/22 0219 10/07/22 0444 10/07/22 0710  BP:  131/83 123/72 105/62  Pulse:  82 83 84  Resp:  18 18 18   Temp: 98.1 F (36.7 C) 98 F (36.7 C) 98.3 F (36.8 C) 98.3 F (36.8 C)  TempSrc: Oral Oral Oral   SpO2:  99% 98% 96%  Weight:      Height:        Intake/Output Summary (Last 24 hours) at 10/07/2022 1001 Last data filed at 10/07/2022 0040 Gross per 24 hour  Intake 1500 ml  Output --  Net 1500 ml   Filed Weights   10/06/22 1355  Weight: 84.8 kg    Examination:  General exam: Appears calm and comfortable.  On room air. Respiratory system: Bilateral decreased breath sounds at bases, no wheezing Cardiovascular system: S1 & S2 heard, Rate controlled Gastrointestinal system: Abdomen is distended, soft and tender on the right side with mild ecchymosis; normal bowel sounds heard. Extremities: No cyanosis, clubbing, edema  Central nervous system: Alert and oriented.  Slow to respond.  No focal neurological deficits. Moving extremities Skin: No rashes, lesions or ulcers Psychiatry: Flat affect.  Not agitated.   Data Reviewed: I have personally reviewed following labs and imaging studies  CBC: Recent Labs  Lab 10/06/22 1437 10/06/22 1915 10/06/22 2352  WBC 7.9  --   --   NEUTROABS 6.0  --   --   HGB 12.4* 10.6* 10.3*  HCT 35.5* 30.1* 29.8*  MCV 88.5  --   --  PLT 225  --   --    Basic Metabolic Panel: Recent Labs  Lab 10/06/22 1437 10/06/22 2352  NA 130* 131*  K 3.7 4.1  CL 97* 102  CO2 25 22  GLUCOSE 114* 119*  BUN 9 8  CREATININE 0.69 0.71  CALCIUM 8.4* 7.6*   GFR: Estimated Creatinine Clearance: 85.2 mL/min (by C-G formula based on  SCr of 0.71 mg/dL). Liver Function Tests: Recent Labs  Lab 10/06/22 1437  AST 17  ALT 19  ALKPHOS 87  BILITOT 0.7  PROT 6.6  ALBUMIN 3.7   Recent Labs  Lab 10/06/22 1437  LIPASE 25   No results for input(s): "AMMONIA" in the last 168 hours. Coagulation Profile: Recent Labs  Lab 10/06/22 1437  INR 1.0   Cardiac Enzymes: No results for input(s): "CKTOTAL", "CKMB", "CKMBINDEX", "TROPONINI" in the last 168 hours. BNP (last 3 results) No results for input(s): "PROBNP" in the last 8760 hours. HbA1C: No results for input(s): "HGBA1C" in the last 72 hours. CBG: No results for input(s): "GLUCAP" in the last 168 hours. Lipid Profile: No results for input(s): "CHOL", "HDL", "LDLCALC", "TRIG", "CHOLHDL", "LDLDIRECT" in the last 72 hours. Thyroid Function Tests: No results for input(s): "TSH", "T4TOTAL", "FREET4", "T3FREE", "THYROIDAB" in the last 72 hours. Anemia Panel: No results for input(s): "VITAMINB12", "FOLATE", "FERRITIN", "TIBC", "IRON", "RETICCTPCT" in the last 72 hours. Sepsis Labs: No results for input(s): "PROCALCITON", "LATICACIDVEN" in the last 168 hours.  No results found for this or any previous visit (from the past 240 hour(s)).       Radiology Studies: CT Angio Abdomen W and/or Wo Contrast  Result Date: 10/06/2022 CLINICAL DATA:  Hematoma, rectus sheath extravasation. CTA recommended. EXAM: CT ANGIOGRAPHY ABDOMEN TECHNIQUE: Multidetector CT imaging of the abdomen was performed using the standard protocol during bolus administration of intravenous contrast. Multiplanar reconstructed images and MIPs were obtained and reviewed to evaluate the vascular anatomy. RADIATION DOSE REDUCTION: This exam was performed according to the departmental dose-optimization program which includes automated exposure control, adjustment of the mA and/or kV according to patient size and/or use of iterative reconstruction technique. CONTRAST:  60mL OMNIPAQUE IOHEXOL 350 MG/ML SOLN  COMPARISON:  CT abdomen and pelvis 10/06/2022 FINDINGS: VASCULAR Aorta: Heterotopic 5 atherosclerotic plaque without hemodynamically significant stenosis, aneurysm, or dissection. Celiac: Calcified plaque causes mild narrowing at the origin. 5 mm thin intimal flap in the proximal celiac axis (series 19/image 56) with mild downstream dilation measuring up to 10 mm. This is unchanged from 08/31/2019. SMA: Patent without hemodynamically significant stenosis, aneurysm, or dissection. Renals: Both renal arteries are patent without evidence of aneurysm, dissection, vasculitis, fibromuscular dysplasia or significant stenosis. IMA: Patent. Inflow: Patent where visualized without aneurysm or dissection. Veins: IVC and portal veins are patent. Other: Redemonstrated large right-sided rectus sheath hematoma. This is similar in size measuring 17.1 x 7.3 x 15.4 cm, previously 15.3 x 7.9 x 15.4 cm. The previous active contrast extravasation is no longer visualized. There is no significant change between noncontrast, arterial, and venous phase imaging. The right epigastric artery is seen at the superior and inferior portions of the hematoma but is not well visualized within the hematoma itself. Subcutaneous fat stranding/edema anterior to the hematoma. Review of the MIP images confirms the above findings. NON-VASCULAR Lower chest: No acute abnormality. Hepatobiliary: No acute abnormality. Pancreas: Unremarkable. Spleen: Unremarkable. Adrenals/Urinary Tract: No acute abnormality. Stomach/Bowel: No acute abnormality. Lymphatic: No lymphadenopathy. Other: No free intraperitoneal fluid or air is visualized. Musculoskeletal: No fracture. IMPRESSION: 1. No  significant change in size of the large right-sided rectus sheath hematoma since CT earlier today. The previous active contrast extravasation has resolved. 2. 5 mm thin intimal flap in the proximal celiac axis with mild downstream dilation measuring up to 10 mm. 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 Aortic Atherosclerosis (ICD10-I70.0). Electronically Signed   By: Minerva Fester M.D.   On: 10/06/2022 19:59   CT ABDOMEN PELVIS W CONTRAST  Addendum Date: 10/06/2022   ADDENDUM REPORT: 10/06/2022 17:39 ADDENDUM: Of note in terms of the rectus sheath hematoma, on delayed imaging the level of active extravasation within the hematoma is quite large. Clear increase in pooling of contrast increasing from standard delayed imaging. Electronically Signed   By: Karen Kays M.D.   On: 10/06/2022 17:39   Result Date: 10/06/2022 CLINICAL DATA:  Right lower quadrant abdominal pain and possible mass. Hernia. EXAM: CT ABDOMEN AND PELVIS WITH CONTRAST TECHNIQUE: Multidetector CT imaging of the abdomen and pelvis was performed using the standard protocol following bolus administration of intravenous contrast. RADIATION DOSE REDUCTION: This exam was performed according to the departmental dose-optimization program which includes automated exposure control, adjustment of the mA and/or kV according to patient size and/or use of iterative reconstruction technique. CONTRAST:  OMNIPAQUE IOHEXOL 300 MG/ML  SOLN COMPARISON:  CT 06/08/2021.  Ultrasound earlier 10/06/2022 FINDINGS: Lower chest: there is some linear opacity lung bases likely scar or atelectasis. No pleural effusion. Calcified nodule at the medial right lower lobe on series 4, image 3 consistent with old granulomatous disease. Small hiatal hernia. Hepatobiliary: No focal liver abnormality is seen. No gallstones, gallbladder wall thickening, or biliary dilatation. Patent portal vein. Pancreas: Unremarkable. No pancreatic ductal dilatation or surrounding inflammatory changes. Spleen: Normal in size without focal abnormality. Adrenals/Urinary Tract: Adrenal glands are preserved. No enhancing renal mass or collecting system dilatation. The ureters have normal course and caliber extending down to the bladder.  17 mm cyst seen along lower pole of the right kidney, Bosniak 1 lesion. Some tiny foci elsewhere, Bosniak 2. No imaging follow up. Preserved contours of the urinary bladder. There is wall thickening and some trabeculation. Prominent prostate with mass effect along the base of the bladder. Stomach/Bowel: On this non oral contrast exam, the large bowel has a normal course and caliber with scattered moderate colonic stool. Normal retrocecal appendix. The stomach and small bowel are nondilated. Scattered luminal debris along the distal small bowel. Vascular/Lymphatic: Aortic atherosclerosis. No enlarged abdominal or pelvic lymph nodes. Several collateral vessels along the right inguinal region. Reproductive: Enlarged prostate with mass effect along the base of the bladder. Please correlate with BPH symptoms in the patient's PSA Other: No free intra-air or free fluid. Musculoskeletal: Scattered degenerative changes along the spine. Scattered multilevel disc bulge. Mild degenerative changes along the pelvis. There is a large rectus sheath hematoma on the right side measuring 15.3 x 7.9 by 15.5 cm. There are areas of active extravasation of contrast within the hematoma. Critical Value/emergent results were called by telephone at the time of interpretation on 10/06/2022 at 2:11 pm to provider [facility] , who verbally acknowledged these results. IMPRESSION: Large right-sided rectus sheath hematoma measuring up to 15.5 cm with a active extravasation of contrast, active hemorrhage. Please correlate with patient's clinical presentation and further evaluation. No additional areas of hematoma or fluid collections in the peritoneal cavity. No bowel obstruction. Normal appendix. Enlarged prostate with mass effect along the bladder. Please correlate with patient's PSA Electronically Signed:  By: Karen Kays M.D. On: 10/06/2022 17:14   US Abdomen Limited  Result Date: 10/06/2022 CLINICAL DATA:  Right upper quadrant pain EXAM:  ULTRASOUND ABDOMEN LIMITED RIGHT UPPER QUADRANT COMPARISON:  Ultrasound 12/18/2019.  CT 06/08/2021 FINDINGS: Gallbladder: Gallbladder is underdistended.  No obvious stones. Common bile duct: Diameter: 3 mm Liver: Diffusely echogenic hepatic parenchyma consistent with fatty liver infiltration. With this level of echogenicity evaluation for underlying mass lesion is limited and if needed follow up contrast CT or MRI as clinically appropriate. Portal vein is patent on color Doppler imaging with normal direction of blood flow towards the liver. Other: None. IMPRESSION: Underdistended gallbladder. No obvious stones. No ductal dilatation. Fatty liver infiltration Electronically Signed   By: Karen Kays M.D.   On: 10/06/2022 15:18        Scheduled Meds:  escitalopram  10 mg Oral Daily   losartan  100 mg Oral Daily   OLANZapine  15 mg Oral QHS   pantoprazole (PROTONIX) IV  40 mg Intravenous Daily   Continuous Infusions:        Glade Lloyd, MD [facility] 10/07/2022, 10:01 AM
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
## 2022-10-07 NOTE — Plan of Care (Signed)

## 2022-10-08 DIAGNOSIS — S301XXA Contusion of abdominal wall, initial encounter: Secondary | ICD-10-CM | POA: Diagnosis not present

## 2022-10-08 LAB — CBC WITH DIFFERENTIAL/PLATELET
Abs Immature Granulocytes: 0.03 10*3/uL (ref 0.00–0.07)
Basophils Absolute: 0 10*3/uL (ref 0.0–0.1)
Basophils Relative: 0 %
Eosinophils Absolute: 0 10*3/uL (ref 0.0–0.5)
Eosinophils Relative: 0 %
HCT: 26.2 % — ABNORMAL LOW (ref 39.0–52.0)
Hemoglobin: 9.1 g/dL — ABNORMAL LOW (ref 13.0–17.0)
Immature Granulocytes: 0 %
Lymphocytes Relative: 19 %
Lymphs Abs: 1.4 10*3/uL (ref 0.7–4.0)
MCH: 31.1 pg (ref 26.0–34.0)
MCHC: 34.7 g/dL (ref 30.0–36.0)
MCV: 89.4 fL (ref 80.0–100.0)
Monocytes Absolute: 0.8 10*3/uL (ref 0.1–1.0)
Monocytes Relative: 10 %
Neutro Abs: 5.2 10*3/uL (ref 1.7–7.7)
Neutrophils Relative %: 71 %
Platelets: 191 10*3/uL (ref 150–400)
RBC: 2.93 MIL/uL — ABNORMAL LOW (ref 4.22–5.81)
RDW: 12.7 % (ref 11.5–15.5)
WBC: 7.4 10*3/uL (ref 4.0–10.5)
nRBC: 0 % (ref 0.0–0.2)

## 2022-10-08 LAB — BASIC METABOLIC PANEL
Anion gap: 8 (ref 5–15)
BUN: 8 mg/dL (ref 8–23)
CO2: 26 mmol/L (ref 22–32)
Calcium: 8.3 mg/dL — ABNORMAL LOW (ref 8.9–10.3)
Chloride: 100 mmol/L (ref 98–111)
Creatinine, Ser: 0.79 mg/dL (ref 0.61–1.24)
GFR, Estimated: >60 mL/min (ref >60)
Glucose, Bld: 97 mg/dL (ref 70–99)
Potassium: 3.7 mmol/L (ref 3.5–5.1)
Sodium: 134 mmol/L — ABNORMAL LOW (ref 135–145)

## 2022-10-08 LAB — MAGNESIUM: Magnesium: 2.1 mg/dL (ref 1.7–2.4)

## 2022-10-08 MED ORDER — BISACODYL 10 MG RE SUPP: 10.0000 mg | Freq: Every day | RECTAL | Status: DC | PRN

## 2022-10-08 MED ORDER — OXYCODONE HCL 5 MG PO TABS
5.0000 mg | ORAL_TABLET | ORAL | Status: DC | PRN
  Administered 2022-10-08 – 2022-10-11 (×10): 10 mg via ORAL
  Filled 2022-10-08 (×10): qty 2

## 2022-10-08 MED ORDER — PANTOPRAZOLE SODIUM 40 MG PO TBEC
40.0000 mg | DELAYED_RELEASE_TABLET | Freq: Every day | ORAL | Status: DC
  Administered 2022-10-08 – 2022-10-11 (×4): 40 mg via ORAL
  Filled 2022-10-08 (×4): qty 1

## 2022-10-08 MED ORDER — POLYETHYLENE GLYCOL 3350 17 G PO PACK: 17.0000 g | PACK | Freq: Every day | ORAL | Status: DC | PRN

## 2022-10-08 MED ORDER — SENNOSIDES-DOCUSATE SODIUM 8.6-50 MG PO TABS
1.0000 | ORAL_TABLET | Freq: Two times a day (BID) | ORAL | Status: DC
  Administered 2022-10-08 – 2022-10-11 (×7): 1 via ORAL
  Filled 2022-10-08 (×7): qty 1

## 2022-10-08 NOTE — Plan of Care (Signed)
Patient alert/oriented X4. Patient compliant with medication administration and given oxycodone as needed for pain. Patient complains of consistent pain in abdominal area. Patient tolerated regular diet. VSS  Problem: Education: Goal: Knowledge of General Education information will improve Description: Including pain rating scale, medication(s)/side effects and non-pharmacologic comfort measures Outcome: Progressing   Problem: Health Behavior/Discharge Planning: Goal: Ability to manage health-related needs will improve Outcome: Progressing   Problem: Clinical Measurements: Goal: Ability to maintain clinical measurements within normal limits will improve Outcome: Progressing   Problem: Clinical Measurements: Goal: Will remain free from infection Outcome: Progressing   Problem: Clinical Measurements: Goal: Diagnostic test results will improve Outcome: Progressing   Problem: Clinical Measurements: Goal: Respiratory complications will improve Outcome: Progressing   Problem: Clinical Measurements: Goal: Cardiovascular complication will be avoided Outcome: Progressing   Problem: Activity: Goal: Risk for activity intolerance will decrease Outcome: Progressing   Problem: Nutrition: Goal: Adequate nutrition will be maintained Outcome: Progressing   Problem: Coping: Goal: Level of anxiety will decrease Outcome: Progressing   Problem: Elimination: Goal: Will not experience complications related to bowel motility Outcome: Progressing   Problem: Elimination: Goal: Will not experience complications related to urinary retention Outcome: Progressing   Problem: Pain Managment: Goal: General experience of comfort will improve Outcome: Progressing   Problem: Safety: Goal: Ability to remain free from injury will improve Outcome: Progressing   Problem: Skin Integrity: Goal: Risk for impaired skin integrity will decrease Outcome: Progressing

## 2022-10-08 NOTE — Progress Notes (Signed)
PROGRESS NOTE    Jim Collins  AOZ:[id number] DOB: 11/13/1950 DOA: 10/06/2022 PCP: Jason Coop, FNP   Brief Narrative:  72 y.o. male with medical history significant of hypertension, GERD, schizophrenia, depression with anxiety presented with worsening abdominal pain.  On presentation, sodium was 130, lipase 25.  Right upper quadrant ultrasound showed no obvious stones or biliary ductal dilatation; showed fatty liver infiltration. CT abdomen and pelvis with contrast showed large right-sided rectus sheath hematoma measuring up to 15.5 cm with an active extravasation of contrast, active hemorrhage. CT angiography of abdomen showed no significant change in size of the large right-sided rectus sheath hematoma since CT earlier today. The previous active contrast extravasation has resolved.  General surgery was consulted at [facility] who recommended IR consultation.  IR recommended to transfer the patient to [facility].  Assessment & Plan:   Rectus sheath hematoma -Questionable cause.  Imaging as above. -IR recommending conservative management.  Continue pain management.    Acute blood loss anemia/normocytic anemia -Hemoglobin has slightly dropped.  Currently stable.  Monitor.  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  Vascular surgery evaluation appreciated.  Recommended conservative management and outpatient follow-up  Hyponatremia -Mild.  Encourage oral intake.  DC IV fluids.  Essential hypertension -Blood pressure on the lower side.  Losartan on hold.  Schizophrenia/depression with anxiety -Continue escitalopram and olanzapine  GERD -Continue Protonix  Obesity -Outpatient follow-up   DVT prophylaxis: SCDs Code Status: Full Family Communication: Daughter-in-law at bedside on 10/07/2022 Disposition Plan: Status is: Inpatient Remains inpatient appropriate because: Of severity of illness    Consultants: IR/vascular  surgery  Procedures: None  Antimicrobials: None   Subjective: Patient seen and examined at bedside.  Still complains of intermittent severe abdominal pain.  No nausea, vomiting or fever reported. Objective: Vitals:   10/07/22 1600 10/07/22 1938 10/08/22 0534 10/08/22 0722  BP: 127/79 (!) 120/56 126/67 110/68  Pulse: 85 75 70 84  Resp: 18 16 17 16   Temp: 98.5 F (36.9 C) 98.6 F (37 C) 98.2 F (36.8 C) 99.3 F (37.4 C)  TempSrc: Oral Oral Oral Oral  SpO2: 97% 95% 98% 100%  Weight:      Height:       No intake or output data in the 24 hours ending 10/08/22 0855  Filed Weights   10/06/22 1355  Weight: 84.8 kg    Examination:  General: Currently on room air.  No distress.  Elderly male lying in bed. ENT/neck: No thyromegaly.  JVD is not elevated  respiratory: Decreased breath sounds at bases bilaterally with some crackles; no wheezing  CVS: S1-S2 heard, rate controlled currently Abdominal: Soft, mildly tender, mostly in the right side; slightly distended; no organomegaly,  bowel sounds are heard Extremities: Trace lower extremity edema; no cyanosis  CNS: Awake and alert.  Still slow to respond.  No focal neurologic deficit.  Moves extremities Lymph: No obvious lymphadenopathy Skin: No obvious ecchymosis/lesions  psych: Mostly flat affect.  Currently not agitated.   Musculoskeletal: No obvious joint swelling/deformity    Data Reviewed: I have personally reviewed following labs and imaging studies  CBC: Recent Labs  Lab 10/06/22 1437 10/06/22 1915 10/06/22 2352 10/07/22 1134 10/08/22 0610  WBC 7.9  --   --  9.4 7.4  NEUTROABS 6.0  --   --   --  5.2  HGB 12.4* 10.6* 10.3* 9.9* 9.1*  HCT 35.5* 30.1* 29.8* 28.4* 26.2*  MCV 88.5  --   --  89.3 89.4  PLT 225  --   --  199 191   Basic Metabolic Panel: Recent Labs  Lab 10/06/22 1437 10/06/22 2352 10/07/22 1134 10/08/22 0610  NA 130* 131* 135 134*  K 3.7 4.1 3.8 3.7  CL 97* 102 99 100  CO2 25 22 24 26    GLUCOSE 114* 119* 107* 97  BUN 9 8 7* 8  CREATININE 0.69 0.71 0.75 0.79  CALCIUM 8.4* 7.6* 8.0* 8.3*  MG  --   --  1.9 2.1  PHOS  --   --  2.8  --    GFR: Estimated Creatinine Clearance: 85.2 mL/min (by C-G formula based on SCr of 0.79 mg/dL). Liver Function Tests: Recent Labs  Lab 10/06/22 1437 10/07/22 1134  AST 17 15  ALT 19 18  ALKPHOS 87 69  BILITOT 0.7 1.0  PROT 6.6 5.6*  ALBUMIN 3.7 3.2*   Recent Labs  Lab 10/06/22 1437  LIPASE 25   No results for input(s): "AMMONIA" in the last 168 hours. Coagulation Profile: Recent Labs  Lab 10/06/22 1437  INR 1.0   Cardiac Enzymes: No results for input(s): "CKTOTAL", "CKMB", "CKMBINDEX", "TROPONINI" in the last 168 hours. BNP (last 3 results) No results for input(s): "PROBNP" in the last 8760 hours. HbA1C: No results for input(s): "HGBA1C" in the last 72 hours. CBG: No results for input(s): "GLUCAP" in the last 168 hours. Lipid Profile: No results for input(s): "CHOL", "HDL", "LDLCALC", "TRIG", "CHOLHDL", "LDLDIRECT" in the last 72 hours. Thyroid Function Tests: No results for input(s): "TSH", "T4TOTAL", "FREET4", "T3FREE", "THYROIDAB" in the last 72 hours. Anemia Panel: No results for input(s): "VITAMINB12", "FOLATE", "FERRITIN", "TIBC", "IRON", "RETICCTPCT" in the last 72 hours. Sepsis Labs: No results for input(s): "PROCALCITON", "LATICACIDVEN" in the last 168 hours.  No results found for this or any previous visit (from the past 240 hour(s)).       Radiology Studies: CT Angio Abdomen W and/or Wo Contrast  Result Date: 10/06/2022 CLINICAL DATA:  Hematoma, rectus sheath extravasation. CTA recommended. EXAM: CT ANGIOGRAPHY ABDOMEN TECHNIQUE: Multidetector CT imaging of the abdomen was performed using the standard protocol during bolus administration of intravenous contrast. Multiplanar reconstructed images and MIPs were obtained and reviewed to evaluate the vascular anatomy. RADIATION DOSE REDUCTION: This exam  was performed according to the departmental dose-optimization program which includes automated exposure control, adjustment of the mA and/or kV according to patient size and/or use of iterative reconstruction technique. CONTRAST:  60mL OMNIPAQUE IOHEXOL 350 MG/ML SOLN COMPARISON:  CT abdomen and pelvis 10/06/2022 FINDINGS: VASCULAR Aorta: Heterotopic 5 atherosclerotic plaque without hemodynamically significant stenosis, aneurysm, or dissection. Celiac: Calcified plaque causes mild narrowing at the origin. 5 mm thin intimal flap in the proximal celiac axis (series 19/image 56) with mild downstream dilation measuring up to 10 mm. This is unchanged from 08/31/2019. SMA: Patent without hemodynamically significant stenosis, aneurysm, or dissection. Renals: Both renal arteries are patent without evidence of aneurysm, dissection, vasculitis, fibromuscular dysplasia or significant stenosis. IMA: Patent. Inflow: Patent where visualized without aneurysm or dissection. Veins: IVC and portal veins are patent. Other: Redemonstrated large right-sided rectus sheath hematoma. This is similar in size measuring 17.1 x 7.3 x 15.4 cm, previously 15.3 x 7.9 x 15.4 cm. The previous active contrast extravasation is no longer visualized. There is no significant change between noncontrast, arterial, and venous phase imaging. The right epigastric artery is seen at the superior and inferior portions of the hematoma but is not well visualized within the hematoma itself. Subcutaneous fat  stranding/edema anterior to the hematoma. Review of the MIP images confirms the above findings. NON-VASCULAR Lower chest: No acute abnormality. Hepatobiliary: No acute abnormality. Pancreas: Unremarkable. Spleen: Unremarkable. Adrenals/Urinary Tract: No acute abnormality. Stomach/Bowel: No acute abnormality. Lymphatic: No lymphadenopathy. Other: No free intraperitoneal fluid or air is visualized. Musculoskeletal: No fracture. IMPRESSION: 1. No significant  change in size of the large right-sided rectus sheath hematoma since CT earlier today. The previous active contrast extravasation has resolved. 2. 5 mm thin intimal flap in the proximal celiac axis with mild downstream dilation measuring up to 10 mm. 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 Aortic Atherosclerosis (ICD10-I70.0). Electronically Signed   By: Minerva Fester M.D.   On: 10/06/2022 19:59   CT ABDOMEN PELVIS W CONTRAST  Addendum Date: 10/06/2022   ADDENDUM REPORT: 10/06/2022 17:39 ADDENDUM: Of note in terms of the rectus sheath hematoma, on delayed imaging the level of active extravasation within the hematoma is quite large. Clear increase in pooling of contrast increasing from standard delayed imaging. Electronically Signed   By: Karen Kays M.D.   On: 10/06/2022 17:39   Result Date: 10/06/2022 CLINICAL DATA:  Right lower quadrant abdominal pain and possible mass. Hernia. EXAM: CT ABDOMEN AND PELVIS WITH CONTRAST TECHNIQUE: Multidetector CT imaging of the abdomen and pelvis was performed using the standard protocol following bolus administration of intravenous contrast. RADIATION DOSE REDUCTION: This exam was performed according to the departmental dose-optimization program which includes automated exposure control, adjustment of the mA and/or kV according to patient size and/or use of iterative reconstruction technique. CONTRAST:  OMNIPAQUE IOHEXOL 300 MG/ML  SOLN COMPARISON:  CT 06/08/2021.  Ultrasound earlier 10/06/2022 FINDINGS: Lower chest: there is some linear opacity lung bases likely scar or atelectasis. No pleural effusion. Calcified nodule at the medial right lower lobe on series 4, image 3 consistent with old granulomatous disease. Small hiatal hernia. Hepatobiliary: No focal liver abnormality is seen. No gallstones, gallbladder wall thickening, or biliary dilatation. Patent portal vein. Pancreas: Unremarkable. No pancreatic ductal  dilatation or surrounding inflammatory changes. Spleen: Normal in size without focal abnormality. Adrenals/Urinary Tract: Adrenal glands are preserved. No enhancing renal mass or collecting system dilatation. The ureters have normal course and caliber extending down to the bladder. 17 mm cyst seen along lower pole of the right kidney, Bosniak 1 lesion. Some tiny foci elsewhere, Bosniak 2. No imaging follow up. Preserved contours of the urinary bladder. There is wall thickening and some trabeculation. Prominent prostate with mass effect along the base of the bladder. Stomach/Bowel: On this non oral contrast exam, the large bowel has a normal course and caliber with scattered moderate colonic stool. Normal retrocecal appendix. The stomach and small bowel are nondilated. Scattered luminal debris along the distal small bowel. Vascular/Lymphatic: Aortic atherosclerosis. No enlarged abdominal or pelvic lymph nodes. Several collateral vessels along the right inguinal region. Reproductive: Enlarged prostate with mass effect along the base of the bladder. Please correlate with BPH symptoms in the patient's PSA Other: No free intra-air or free fluid. Musculoskeletal: Scattered degenerative changes along the spine. Scattered multilevel disc bulge. Mild degenerative changes along the pelvis. There is a large rectus sheath hematoma on the right side measuring 15.3 x 7.9 by 15.5 cm. There are areas of active extravasation of contrast within the hematoma. Critical Value/emergent results were called by telephone at the time of interpretation on 10/06/2022 at 2:11 pm to provider [facility] , who verbally acknowledged these results. IMPRESSION: Large right-sided rectus sheath  hematoma measuring up to 15.5 cm with a active extravasation of contrast, active hemorrhage. Please correlate with patient's clinical presentation and further evaluation. No additional areas of hematoma or fluid collections in the peritoneal cavity. No bowel  obstruction. Normal appendix. Enlarged prostate with mass effect along the bladder. Please correlate with patient's PSA Electronically Signed: By: Karen Kays M.D. On: 10/06/2022 17:14   US Abdomen Limited  Result Date: 10/06/2022 CLINICAL DATA:  Right upper quadrant pain EXAM: ULTRASOUND ABDOMEN LIMITED RIGHT UPPER QUADRANT COMPARISON:  Ultrasound 12/18/2019.  CT 06/08/2021 FINDINGS: Gallbladder: Gallbladder is underdistended.  No obvious stones. Common bile duct: Diameter: 3 mm Liver: Diffusely echogenic hepatic parenchyma consistent with fatty liver infiltration. With this level of echogenicity evaluation for underlying mass lesion is limited and if needed follow up contrast CT or MRI as clinically appropriate. Portal vein is patent on color Doppler imaging with normal direction of blood flow towards the liver. Other: None. IMPRESSION: Underdistended gallbladder. No obvious stones. No ductal dilatation. Fatty liver infiltration Electronically Signed   By: Karen Kays M.D.   On: 10/06/2022 15:18        Scheduled Meds:  escitalopram  10 mg Oral Daily   OLANZapine  15 mg Oral QHS   pantoprazole (PROTONIX) IV  40 mg Intravenous Daily   Continuous Infusions:  sodium chloride 75 mL/hr at 10/07/22 1254          Dorotea Hand Hanley Ben, MD [facility] 10/08/2022, 8:55 AM

## 2022-10-08 NOTE — Plan of Care (Signed)

## 2022-10-08 NOTE — Plan of Care (Signed)
IR has been following remotely for the right-sided rectus sheath hematoma, vital sign and hgb has been stable.   RN reports patient still have persistent pain, but not getting worse.  Expected to have pain for days to possible weeks given the size of the hematoma, the pain should gradually get better.  No urgent interventions indicated, IR will sing off but will remain available for assistant.   Please contact On Call IR provider is there is significant drop in hgb, changes in VS and/or patient condition.    Lynann Bologna Lyzette Reinhardt PA-C 10/08/2022 3:42 PM

## 2022-10-09 DIAGNOSIS — S301XXA Contusion of abdominal wall, initial encounter: Secondary | ICD-10-CM | POA: Diagnosis not present

## 2022-10-09 LAB — CBC WITH DIFFERENTIAL/PLATELET
Abs Immature Granulocytes: 0.03 10*3/uL (ref 0.00–0.07)
Basophils Absolute: 0 10*3/uL (ref 0.0–0.1)
Basophils Relative: 0 %
Eosinophils Absolute: 0 10*3/uL (ref 0.0–0.5)
Eosinophils Relative: 0 %
HCT: 25.2 % — ABNORMAL LOW (ref 39.0–52.0)
Hemoglobin: 8.7 g/dL — ABNORMAL LOW (ref 13.0–17.0)
Immature Granulocytes: 1 %
Lymphocytes Relative: 25 %
Lymphs Abs: 1.6 10*3/uL (ref 0.7–4.0)
MCH: 30.2 pg (ref 26.0–34.0)
MCHC: 34.5 g/dL (ref 30.0–36.0)
MCV: 87.5 fL (ref 80.0–100.0)
Monocytes Absolute: 0.6 10*3/uL (ref 0.1–1.0)
Monocytes Relative: 10 %
Neutro Abs: 4.1 10*3/uL (ref 1.7–7.7)
Neutrophils Relative %: 64 %
Platelets: 176 10*3/uL (ref 150–400)
RBC: 2.88 MIL/uL — ABNORMAL LOW (ref 4.22–5.81)
RDW: 12.5 % (ref 11.5–15.5)
WBC: 6.3 10*3/uL (ref 4.0–10.5)
nRBC: 0 % (ref 0.0–0.2)

## 2022-10-09 LAB — BASIC METABOLIC PANEL
Anion gap: 9 (ref 5–15)
BUN: 8 mg/dL (ref 8–23)
CO2: 25 mmol/L (ref 22–32)
Calcium: 7.9 mg/dL — ABNORMAL LOW (ref 8.9–10.3)
Chloride: 101 mmol/L (ref 98–111)
Creatinine, Ser: 0.85 mg/dL (ref 0.61–1.24)
GFR, Estimated: >60 mL/min (ref >60)
Glucose, Bld: 97 mg/dL (ref 70–99)
Potassium: 3.6 mmol/L (ref 3.5–5.1)
Sodium: 135 mmol/L (ref 135–145)

## 2022-10-09 LAB — MAGNESIUM: Magnesium: 2.1 mg/dL (ref 1.7–2.4)

## 2022-10-09 NOTE — Plan of Care (Signed)
Patient alert/oriented X4. Patient compliant with medication administration and complains of pain in RLQ of abdomen when ambulating. Patient administered oxycodone as needed for pain. VSS.   Problem: Education: Goal: Knowledge of General Education information will improve Description: Including pain rating scale, medication(s)/side effects and non-pharmacologic comfort measures Outcome: Progressing   Problem: Health Behavior/Discharge Planning: Goal: Ability to manage health-related needs will improve Outcome: Progressing   Problem: Clinical Measurements: Goal: Ability to maintain clinical measurements within normal limits will improve Outcome: Progressing   Problem: Clinical Measurements: Goal: Will remain free from infection Outcome: Progressing   Problem: Clinical Measurements: Goal: Diagnostic test results will improve Outcome: Progressing   Problem: Clinical Measurements: Goal: Respiratory complications will improve Outcome: Progressing   Problem: Clinical Measurements: Goal: Cardiovascular complication will be avoided Outcome: Progressing   Problem: Activity: Goal: Risk for activity intolerance will decrease Outcome: Progressing   Problem: Nutrition: Goal: Adequate nutrition will be maintained Outcome: Progressing   Problem: Coping: Goal: Level of anxiety will decrease Outcome: Progressing   Problem: Elimination: Goal: Will not experience complications related to bowel motility Outcome: Progressing   Problem: Elimination: Goal: Will not experience complications related to urinary retention Outcome: Progressing   Problem: Pain Managment: Goal: General experience of comfort will improve Outcome: Progressing   Problem: Safety: Goal: Ability to remain free from injury will improve Outcome: Progressing   Problem: Skin Integrity: Goal: Risk for impaired skin integrity will decrease Outcome: Progressing

## 2022-10-09 NOTE — Progress Notes (Signed)
PROGRESS NOTE    Jim Collins  KGM:[id number] DOB: 13-May-1950 DOA: 10/06/2022 PCP: Jason Coop, FNP   Brief Narrative:  72 y.o. male with medical history significant of hypertension, GERD, schizophrenia, depression with anxiety presented with worsening abdominal pain.  On presentation, sodium was 130, lipase 25.  Right upper quadrant ultrasound showed no obvious stones or biliary ductal dilatation; showed fatty liver infiltration. CT abdomen and pelvis with contrast showed large right-sided rectus sheath hematoma measuring up to 15.5 cm with an active extravasation of contrast, active hemorrhage. CT angiography of abdomen showed no significant change in size of the large right-sided rectus sheath hematoma since CT earlier today. The previous active contrast extravasation has resolved.  General surgery was consulted at [facility] who recommended IR consultation.  IR recommended to transfer the patient to [facility].  Assessment & Plan:   Rectus sheath hematoma -Questionable cause.  Imaging as above. -IR recommending conservative management.  IR signed off on 10/08/2022.  Continue pain management.    Acute blood loss anemia/normocytic anemia -Hemoglobin slightly trending downwards.  Monitor.  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  Vascular surgery evaluation appreciated.  Recommended conservative management and outpatient follow-up  Hyponatremia -Improved.  Essential hypertension -Blood pressure on the lower side.  Losartan on hold.  Schizophrenia/depression with anxiety -Continue escitalopram and olanzapine  GERD -Continue Protonix  Obesity -Outpatient follow-up   DVT prophylaxis: SCDs Code Status: Full Family Communication: Daughter-in-law at bedside on 10/07/2022 Disposition Plan: Status is: Inpatient Remains inpatient appropriate because: Of severity of illness    Consultants: IR/vascular surgery  Procedures:  None  Antimicrobials: None   Subjective: Patient seen and examined at bedside.  No fever, chest pain, vomiting reported.  Still complains of intermittent severe abdominal pain.   Objective: Vitals:   10/08/22 1632 10/08/22 2041 10/09/22 0505 10/09/22 0758  BP: 131/80 120/68 (!) 116/55 133/76  Pulse: 72 74 73 76  Resp: 16 18 18 16   Temp: 98.6 F (37 C) 98.9 F (37.2 C) 98.6 F (37 C) 99.2 F (37.3 C)  TempSrc: Oral Oral Oral Oral  SpO2: 95% 97% 94% 95%  Weight:      Height:       No intake or output data in the 24 hours ending 10/09/22 0824  Filed Weights   10/06/22 1355  Weight: 84.8 kg    Examination:  General: On room air.  No distress.  Looks chronically ill and deconditioned.  Slow to respond. respiratory: Bilateral decreased breath sounds at bases with scattered crackles  CVS: Currently rate controlled; S1-S2 heard  abdominal: Soft, mildly tender, mostly in the right side; still slightly distended, no organomegaly; normal bowel sounds are heard  extremities: No clubbing; mild lower extremity edema present.    Data Reviewed: I have personally reviewed following labs and imaging studies  CBC: Recent Labs  Lab 10/06/22 1437 10/06/22 1915 10/06/22 2352 10/07/22 1134 10/08/22 0610 10/09/22 0618  WBC 7.9  --   --  9.4 7.4 6.3  NEUTROABS 6.0  --   --   --  5.2 4.1  HGB 12.4* 10.6* 10.3* 9.9* 9.1* 8.7*  HCT 35.5* 30.1* 29.8* 28.4* 26.2* 25.2*  MCV 88.5  --   --  89.3 89.4 87.5  PLT 225  --   --  199 191 176   Basic Metabolic Panel: Recent Labs  Lab 10/06/22 1437 10/06/22 2352 10/07/22 1134 10/08/22 0610 10/09/22 0618  NA 130* 131* 135  134* 135  K 3.7 4.1 3.8 3.7 3.6  CL 97* 102 99 100 101  CO2 25 22 24 26 25   GLUCOSE 114* 119* 107* 97 97  BUN 9 8 7* 8 8  CREATININE 0.69 0.71 0.75 0.79 0.85  CALCIUM 8.4* 7.6* 8.0* 8.3* 7.9*  MG  --   --  1.9 2.1 2.1  PHOS  --   --  2.8  --   --    GFR: Estimated Creatinine Clearance: 80.2 mL/min (by C-G  formula based on SCr of 0.85 mg/dL). Liver Function Tests: Recent Labs  Lab 10/06/22 1437 10/07/22 1134  AST 17 15  ALT 19 18  ALKPHOS 87 69  BILITOT 0.7 1.0  PROT 6.6 5.6*  ALBUMIN 3.7 3.2*   Recent Labs  Lab 10/06/22 1437  LIPASE 25   No results for input(s): "AMMONIA" in the last 168 hours. Coagulation Profile: Recent Labs  Lab 10/06/22 1437  INR 1.0   Cardiac Enzymes: No results for input(s): "CKTOTAL", "CKMB", "CKMBINDEX", "TROPONINI" in the last 168 hours. BNP (last 3 results) No results for input(s): "PROBNP" in the last 8760 hours. HbA1C: No results for input(s): "HGBA1C" in the last 72 hours. CBG: No results for input(s): "GLUCAP" in the last 168 hours. Lipid Profile: No results for input(s): "CHOL", "HDL", "LDLCALC", "TRIG", "CHOLHDL", "LDLDIRECT" in the last 72 hours. Thyroid Function Tests: No results for input(s): "TSH", "T4TOTAL", "FREET4", "T3FREE", "THYROIDAB" in the last 72 hours. Anemia Panel: No results for input(s): "VITAMINB12", "FOLATE", "FERRITIN", "TIBC", "IRON", "RETICCTPCT" in the last 72 hours. Sepsis Labs: No results for input(s): "PROCALCITON", "LATICACIDVEN" in the last 168 hours.  No results found for this or any previous visit (from the past 240 hour(s)).       Radiology Studies: No results found.      Scheduled Meds:  escitalopram  10 mg Oral Daily   OLANZapine  15 mg Oral QHS   pantoprazole  40 mg Oral Daily   senna-docusate  1 tablet Oral BID   Continuous Infusions:          Glade Lloyd, MD [facility] 10/09/2022, 8:24 AM

## 2022-10-09 NOTE — Progress Notes (Signed)
PT Cancellation Note  Patient Details Name: Jim Collins MRN: [id number] DOB: 07/06/1950   Cancelled Treatment:    Reason Eval/Treat Not Completed: PT screened, no needs identified, will sign off - Per RN, pt has been independent with mobility in room. Per pt and pt's grandson, pt remains independent with mobility and is at baseline level of functioning, just has abdominal pain. Both pt and grandson request PT to sign off given independence.  Marye Round, PT DPT Acute Rehabilitation Services Secure Chat Preferred  Office [phone]    Truddie Coco 10/09/2022, 3:46 PM

## 2022-10-10 DIAGNOSIS — S301XXA Contusion of abdominal wall, initial encounter: Secondary | ICD-10-CM | POA: Diagnosis not present

## 2022-10-10 LAB — CBC WITH DIFFERENTIAL/PLATELET
Abs Immature Granulocytes: 0.04 10*3/uL (ref 0.00–0.07)
Basophils Absolute: 0 10*3/uL (ref 0.0–0.1)
Basophils Relative: 0 %
Eosinophils Absolute: 0 10*3/uL (ref 0.0–0.5)
Eosinophils Relative: 0 %
HCT: 26.6 % — ABNORMAL LOW (ref 39.0–52.0)
Hemoglobin: 9.1 g/dL — ABNORMAL LOW (ref 13.0–17.0)
Immature Granulocytes: 1 %
Lymphocytes Relative: 21 %
Lymphs Abs: 1.4 10*3/uL (ref 0.7–4.0)
MCH: 29.8 pg (ref 26.0–34.0)
MCHC: 34.2 g/dL (ref 30.0–36.0)
MCV: 87.2 fL (ref 80.0–100.0)
Monocytes Absolute: 0.7 10*3/uL (ref 0.1–1.0)
Monocytes Relative: 10 %
Neutro Abs: 4.5 10*3/uL (ref 1.7–7.7)
Neutrophils Relative %: 68 %
Platelets: 208 10*3/uL (ref 150–400)
RBC: 3.05 MIL/uL — ABNORMAL LOW (ref 4.22–5.81)
RDW: 12.5 % (ref 11.5–15.5)
WBC: 6.5 10*3/uL (ref 4.0–10.5)
nRBC: 0 % (ref 0.0–0.2)

## 2022-10-10 LAB — BASIC METABOLIC PANEL
Anion gap: 10 (ref 5–15)
BUN: 7 mg/dL — ABNORMAL LOW (ref 8–23)
CO2: 25 mmol/L (ref 22–32)
Calcium: 8.1 mg/dL — ABNORMAL LOW (ref 8.9–10.3)
Chloride: 100 mmol/L (ref 98–111)
Creatinine, Ser: 0.82 mg/dL (ref 0.61–1.24)
GFR, Estimated: >60 mL/min (ref >60)
Glucose, Bld: 99 mg/dL (ref 70–99)
Potassium: 3.5 mmol/L (ref 3.5–5.1)
Sodium: 135 mmol/L (ref 135–145)

## 2022-10-10 LAB — MAGNESIUM: Magnesium: 2.1 mg/dL (ref 1.7–2.4)

## 2022-10-10 NOTE — Plan of Care (Signed)

## 2022-10-10 NOTE — Progress Notes (Signed)
PROGRESS NOTE    Jim Collins  BJY:[id number] DOB: Apr 28, 1950 DOA: 10/06/2022 PCP: Jason Coop, FNP   Brief Narrative:  72 y.o. male with medical history significant of hypertension, GERD, schizophrenia, depression with anxiety presented with worsening abdominal pain.  On presentation, sodium was 130, lipase 25.  Right upper quadrant ultrasound showed no obvious stones or biliary ductal dilatation; showed fatty liver infiltration. CT abdomen and pelvis with contrast showed large right-sided rectus sheath hematoma measuring up to 15.5 cm with an active extravasation of contrast, active hemorrhage. CT angiography of abdomen showed no significant change in size of the large right-sided rectus sheath hematoma since CT earlier today. The previous active contrast extravasation has resolved.  General surgery was consulted at [facility] who recommended IR consultation.  IR recommended to transfer the patient to [facility].  Assessment & Plan:   Rectus sheath hematoma -Questionable cause.  Imaging as above. -IR recommending conservative management.  IR signed off on 10/08/2022.  Continue pain management.  Still complains of significant pain and does not feel ready to go home today.  Acute blood loss anemia/normocytic anemia -Hemoglobin slightly trending downwards but stable at 9.1 today.  Monitor.  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  Vascular surgery evaluation appreciated.  Recommended conservative management and outpatient follow-up  Hyponatremia -Improved.  Essential hypertension -Blood pressure on the lower side.  Losartan on hold.  Schizophrenia/depression with anxiety -Continue escitalopram and olanzapine  GERD -Continue Protonix  Obesity -Outpatient follow-up   DVT prophylaxis: SCDs Code Status: Full Family Communication: Daughter-in-law at bedside on 10/07/2022 Disposition Plan: Status is: Inpatient Remains inpatient  appropriate because: Of severity of illness    Consultants: IR/vascular surgery  Procedures: None  Antimicrobials: None   Subjective: Patient seen and examined at bedside.  No fever, chest pain, vomiting reported.   Still complains of significant abdominal pain and does not feel ready to go home today.  Objective: Vitals:   10/09/22 1547 10/09/22 1953 10/10/22 0525 10/10/22 0738  BP: (!) 148/81 131/73 124/60 115/64  Pulse: 73 74 64 74  Resp: 16 16 18 15   Temp: 98.9 F (37.2 C) 98.5 F (36.9 C) 98.7 F (37.1 C) 98.6 F (37 C)  TempSrc: Oral Oral Oral Oral  SpO2: 95% 96% 99% 93%  Weight:      Height:        Intake/Output Summary (Last 24 hours) at 10/10/2022 1052 Last data filed at 10/10/2022 0845 Gross per 24 hour  Intake 120 ml  Output --  Net 120 ml    Filed Weights   10/06/22 1355  Weight: 84.8 kg    Examination:  General: No acute distress.  Currently on room air.  Looks chronically ill and deconditioned.  Slow to respond. respiratory: Bilateral decreased breath sounds at bases with some crackles CVS: S1 and S2 are heard; rate currently controlled abdominal: Soft, still tender in the right side with some ecchymosis and distention; no organomegaly; bowel sounds heard extremities: Trace lower extremity edema; no cyanosis  Data Reviewed: I have personally reviewed following labs and imaging studies  CBC: Recent Labs  Lab 10/06/22 1437 10/06/22 1915 10/06/22 2352 10/07/22 1134 10/08/22 0610 10/09/22 0618 10/10/22 0559  WBC 7.9  --   --  9.4 7.4 6.3 6.5  NEUTROABS 6.0  --   --   --  5.2 4.1 4.5  HGB 12.4*   < > 10.3* 9.9* 9.1* 8.7* 9.1*  HCT 35.5*   < >  29.8* 28.4* 26.2* 25.2* 26.6*  MCV 88.5  --   --  89.3 89.4 87.5 87.2  PLT 225  --   --  199 191 176 208   < > = values in this interval not displayed.   Basic Metabolic Panel: Recent Labs  Lab 10/06/22 2352 10/07/22 1134 10/08/22 0610 10/09/22 0618 10/10/22 0559  NA 131* 135 134* 135 135  K 4.1  3.8 3.7 3.6 3.5  CL 102 99 100 101 100  CO2 22 24 26 25 25   GLUCOSE 119* 107* 97 97 99  BUN 8 7* 8 8 7*  CREATININE 0.71 0.75 0.79 0.85 0.82  CALCIUM 7.6* 8.0* 8.3* 7.9* 8.1*  MG  --  1.9 2.1 2.1 2.1  PHOS  --  2.8  --   --   --    GFR: Estimated Creatinine Clearance: 83.2 mL/min (by C-G formula based on SCr of 0.82 mg/dL). Liver Function Tests: Recent Labs  Lab 10/06/22 1437 10/07/22 1134  AST 17 15  ALT 19 18  ALKPHOS 87 69  BILITOT 0.7 1.0  PROT 6.6 5.6*  ALBUMIN 3.7 3.2*   Recent Labs  Lab 10/06/22 1437  LIPASE 25   No results for input(s): "AMMONIA" in the last 168 hours. Coagulation Profile: Recent Labs  Lab 10/06/22 1437  INR 1.0   Cardiac Enzymes: No results for input(s): "CKTOTAL", "CKMB", "CKMBINDEX", "TROPONINI" in the last 168 hours. BNP (last 3 results) No results for input(s): "PROBNP" in the last 8760 hours. HbA1C: No results for input(s): "HGBA1C" in the last 72 hours. CBG: No results for input(s): "GLUCAP" in the last 168 hours. Lipid Profile: No results for input(s): "CHOL", "HDL", "LDLCALC", "TRIG", "CHOLHDL", "LDLDIRECT" in the last 72 hours. Thyroid Function Tests: No results for input(s): "TSH", "T4TOTAL", "FREET4", "T3FREE", "THYROIDAB" in the last 72 hours. Anemia Panel: No results for input(s): "VITAMINB12", "FOLATE", "FERRITIN", "TIBC", "IRON", "RETICCTPCT" in the last 72 hours. Sepsis Labs: No results for input(s): "PROCALCITON", "LATICACIDVEN" in the last 168 hours.  No results found for this or any previous visit (from the past 240 hour(s)).       Radiology Studies: No results found.      Scheduled Meds:  escitalopram  10 mg Oral Daily   OLANZapine  15 mg Oral QHS   pantoprazole  40 mg Oral Daily   senna-docusate  1 tablet Oral BID   Continuous Infusions:          Glade Lloyd, MD [facility] 10/10/2022, 10:52 AM

## 2022-10-11 DIAGNOSIS — S301XXA Contusion of abdominal wall, initial encounter: Secondary | ICD-10-CM | POA: Diagnosis not present

## 2022-10-11 MED ORDER — OXYCODONE-ACETAMINOPHEN 5-325 MG PO TABS: 1.0000 | ORAL_TABLET | Freq: Four times a day (QID) | ORAL | Status: DC | PRN

## 2022-10-11 NOTE — Discharge Summary (Signed)
Physician Discharge Summary  Jim Collins UJW:[id number] DOB: Feb 19, 1950 DOA: 10/06/2022  PCP: Jason Coop, FNP  Admit date: 10/06/2022 Discharge date: 10/11/2022  Admitted From: Home Disposition: Home  Recommendations for Outpatient Follow-up:  Follow up with PCP in 1 week with repeat CBC/BMP Follow up in ED if symptoms worsen or new appear   Home Health: No Equipment/Devices: None  Discharge Condition: Stable CODE STATUS: Full Diet recommendation: Heart healthy  Brief/Interim Summary: 72 y.o. male with medical history significant of hypertension, GERD, schizophrenia, depression with anxiety presented with worsening abdominal pain.  On presentation, sodium was 130, lipase 25.  Right upper quadrant ultrasound showed no obvious stones or biliary ductal dilatation; showed fatty liver infiltration. CT abdomen and pelvis with contrast showed large right-sided rectus sheath hematoma measuring up to 15.5 cm with an active extravasation of contrast, active hemorrhage. CT angiography of abdomen showed no significant change in size of the large right-sided rectus sheath hematoma since CT earlier today. The previous active contrast extravasation has resolved.  General surgery was consulted at [facility] who recommended IR consultation.  IR recommended to transfer the patient to [facility].  Subsequently, IR recommended conservative management.  During the hospitalization, his condition has gradually improved.  He is still having abdominal pain but controlled with oral pain meds.  Hemoglobin has remained stable.  He feels okay to go home today.  Discharge patient home today.  Outpatient follow-up with PCP.  Discharge Diagnoses:   Rectus sheath hematoma -Questionable cause.  Imaging as above. -IR recommending conservative management.  IR signed off on 10/08/2022.  -During the hospitalization, his condition has gradually improved.  He is still having abdominal pain but controlled  with oral pain meds.  Hemoglobin has remained stable.  He feels okay to go home today.  Discharge patient home today.  Outpatient follow-up with PCP.  Acute blood loss anemia/normocytic anemia -Hemoglobin slightly trending downwards but stable at 9.1 today.  Monitor.  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  Vascular surgery evaluation appreciated.  Recommended conservative management and outpatient follow-up with yearly ultrasounds.   Hyponatremia -Improved.   Essential hypertension -Blood pressure on the lower side.  Antihypertensives will remain on hold till reevaluation by PCP.  Schizophrenia/depression with anxiety -Continue escitalopram and olanzapine   GERD -Continue Protonix   Obesity -Outpatient follow-up  Chronic pain syndrome/chronic opiate use -Patient is on oral narcotics chronically and follows up with PCP or?  Pain management for the same.  As per PDMP, he was recently prescribed oral oxycodone/acetaminophen: He will not need any new prescription for the same.  Outpatient follow-up with PCP and/or pain management regarding the same.   Discharge Instructions  Discharge Instructions     Diet - low sodium heart healthy   Complete by: As directed    Increase activity slowly   Complete by: As directed       Allergies as of 10/11/2022       Reactions   Haloperidol Lactate    REACTION: "in another world"   Morphine And Codeine    itching   Compazine [prochlorperazine] Anxiety   Thorazine [chlorpromazine] Anxiety        Medication List     STOP taking these medications    amLODipine 5 MG tablet Commonly known as: NORVASC   esomeprazole 20 MG capsule Commonly known as: NEXIUM   losartan 100 MG tablet Commonly known as: COZAAR   Oxycodone HCl 10 MG Tabs  TAKE these medications    escitalopram 10 MG tablet Commonly known as: LEXAPRO Take 10 mg by mouth daily.   OLANZapine 15 MG tablet Commonly known as:  ZYPREXA Take 15 mg by mouth at bedtime.   ondansetron 4 MG disintegrating tablet Commonly known as: Zofran ODT Take 1 tablet (4 mg total) by mouth every 6 (six) hours as needed for nausea or vomiting.   oxyCODONE-acetaminophen 5-325 MG tablet Commonly known as: PERCOCET/ROXICET Take 1 tablet by mouth every 6 (six) hours as needed for severe pain.   pantoprazole 40 MG tablet Commonly known as: PROTONIX Take one capsule once to twice daily before a meal   polyethylene glycol 17 g packet Commonly known as: MiraLax Mix-In Pax Take 17 grams (one pack) mixed in 4-6 ounces of liquid twice daily until soft bowel movement, then continue once daily thereafter.   sucralfate 1 g tablet Commonly known as: CARAFATE Take 1 g by mouth in the morning, at noon, and at bedtime. As needed        Follow-up Information     Jason Coop, FNP. Schedule an appointment as soon as possible for a visit in 1 week(s).   Specialty: Family Medicine Contact information: [facility] [address] [place] [address] [phone]                Allergies  Allergen Reactions   Haloperidol Lactate     REACTION: "in another world"   Morphine And Codeine     itching   Compazine [Prochlorperazine] Anxiety   Thorazine [Chlorpromazine] Anxiety    Consultations: IR/vascular surgery   Procedures/Studies: CT Angio Abdomen W and/or Wo Contrast  Result Date: 10/06/2022 CLINICAL DATA:  Hematoma, rectus sheath extravasation. CTA recommended. EXAM: CT ANGIOGRAPHY ABDOMEN TECHNIQUE: Multidetector CT imaging of the abdomen was performed using the standard protocol during bolus administration of intravenous contrast. Multiplanar reconstructed images and MIPs were obtained and reviewed to evaluate the vascular anatomy. RADIATION DOSE REDUCTION: This exam was performed according to the departmental dose-optimization program which includes automated exposure  control, adjustment of the mA and/or kV according to patient size and/or use of iterative reconstruction technique. CONTRAST:  60mL OMNIPAQUE IOHEXOL 350 MG/ML SOLN COMPARISON:  CT abdomen and pelvis 10/06/2022 FINDINGS: VASCULAR Aorta: Heterotopic 5 atherosclerotic plaque without hemodynamically significant stenosis, aneurysm, or dissection. Celiac: Calcified plaque causes mild narrowing at the origin. 5 mm thin intimal flap in the proximal celiac axis (series 19/image 56) with mild downstream dilation measuring up to 10 mm. This is unchanged from 08/31/2019. SMA: Patent without hemodynamically significant stenosis, aneurysm, or dissection. Renals: Both renal arteries are patent without evidence of aneurysm, dissection, vasculitis, fibromuscular dysplasia or significant stenosis. IMA: Patent. Inflow: Patent where visualized without aneurysm or dissection. Veins: IVC and portal veins are patent. Other: Redemonstrated large right-sided rectus sheath hematoma. This is similar in size measuring 17.1 x 7.3 x 15.4 cm, previously 15.3 x 7.9 x 15.4 cm. The previous active contrast extravasation is no longer visualized. There is no significant change between noncontrast, arterial, and venous phase imaging. The right epigastric artery is seen at the superior and inferior portions of the hematoma but is not well visualized within the hematoma itself. Subcutaneous fat stranding/edema anterior to the hematoma. Review of the MIP images confirms the above findings. NON-VASCULAR Lower chest: No acute abnormality. Hepatobiliary: No acute abnormality. Pancreas: Unremarkable. Spleen: Unremarkable. Adrenals/Urinary Tract: No acute abnormality. Stomach/Bowel: No acute abnormality. Lymphatic: No lymphadenopathy. Other:  No free intraperitoneal fluid or air is visualized. Musculoskeletal: No fracture. IMPRESSION: 1. No significant change in size of the large right-sided rectus sheath hematoma since CT earlier today. The previous active  contrast extravasation has resolved. 2. 5 mm thin intimal flap in the proximal celiac axis with mild downstream dilation measuring up to 10 mm. 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 Aortic Atherosclerosis (ICD10-I70.0). Electronically Signed   By: Minerva Fester M.D.   On: 10/06/2022 19:59   CT ABDOMEN PELVIS W CONTRAST  Addendum Date: 10/06/2022   ADDENDUM REPORT: 10/06/2022 17:39 ADDENDUM: Of note in terms of the rectus sheath hematoma, on delayed imaging the level of active extravasation within the hematoma is quite large. Clear increase in pooling of contrast increasing from standard delayed imaging. Electronically Signed   By: Karen Kays M.D.   On: 10/06/2022 17:39   Result Date: 10/06/2022 CLINICAL DATA:  Right lower quadrant abdominal pain and possible mass. Hernia. EXAM: CT ABDOMEN AND PELVIS WITH CONTRAST TECHNIQUE: Multidetector CT imaging of the abdomen and pelvis was performed using the standard protocol following bolus administration of intravenous contrast. RADIATION DOSE REDUCTION: This exam was performed according to the departmental dose-optimization program which includes automated exposure control, adjustment of the mA and/or kV according to patient size and/or use of iterative reconstruction technique. CONTRAST:  OMNIPAQUE IOHEXOL 300 MG/ML  SOLN COMPARISON:  CT 06/08/2021.  Ultrasound earlier 10/06/2022 FINDINGS: Lower chest: there is some linear opacity lung bases likely scar or atelectasis. No pleural effusion. Calcified nodule at the medial right lower lobe on series 4, image 3 consistent with old granulomatous disease. Small hiatal hernia. Hepatobiliary: No focal liver abnormality is seen. No gallstones, gallbladder wall thickening, or biliary dilatation. Patent portal vein. Pancreas: Unremarkable. No pancreatic ductal dilatation or surrounding inflammatory changes. Spleen: Normal in size without focal abnormality. Adrenals/Urinary  Tract: Adrenal glands are preserved. No enhancing renal mass or collecting system dilatation. The ureters have normal course and caliber extending down to the bladder. 17 mm cyst seen along lower pole of the right kidney, Bosniak 1 lesion. Some tiny foci elsewhere, Bosniak 2. No imaging follow up. Preserved contours of the urinary bladder. There is wall thickening and some trabeculation. Prominent prostate with mass effect along the base of the bladder. Stomach/Bowel: On this non oral contrast exam, the large bowel has a normal course and caliber with scattered moderate colonic stool. Normal retrocecal appendix. The stomach and small bowel are nondilated. Scattered luminal debris along the distal small bowel. Vascular/Lymphatic: Aortic atherosclerosis. No enlarged abdominal or pelvic lymph nodes. Several collateral vessels along the right inguinal region. Reproductive: Enlarged prostate with mass effect along the base of the bladder. Please correlate with BPH symptoms in the patient's PSA Other: No free intra-air or free fluid. Musculoskeletal: Scattered degenerative changes along the spine. Scattered multilevel disc bulge. Mild degenerative changes along the pelvis. There is a large rectus sheath hematoma on the right side measuring 15.3 x 7.9 by 15.5 cm. There are areas of active extravasation of contrast within the hematoma. Critical Value/emergent results were called by telephone at the time of interpretation on 10/06/2022 at 2:11 pm to provider [facility] , who verbally acknowledged these results. IMPRESSION: Large right-sided rectus sheath hematoma measuring up to 15.5 cm with a active extravasation of contrast, active hemorrhage. Please correlate with patient's clinical presentation and further evaluation. No additional areas of hematoma or fluid collections in the peritoneal cavity. No bowel obstruction. Normal appendix. Enlarged  prostate with mass effect along the bladder. Please correlate with patient's  PSA Electronically Signed: By: Karen Kays M.D. On: 10/06/2022 17:14   US Abdomen Limited  Result Date: 10/06/2022 CLINICAL DATA:  Right upper quadrant pain EXAM: ULTRASOUND ABDOMEN LIMITED RIGHT UPPER QUADRANT COMPARISON:  Ultrasound 12/18/2019.  CT 06/08/2021 FINDINGS: Gallbladder: Gallbladder is underdistended.  No obvious stones. Common bile duct: Diameter: 3 mm Liver: Diffusely echogenic hepatic parenchyma consistent with fatty liver infiltration. With this level of echogenicity evaluation for underlying mass lesion is limited and if needed follow up contrast CT or MRI as clinically appropriate. Portal vein is patent on color Doppler imaging with normal direction of blood flow towards the liver. Other: None. IMPRESSION: Underdistended gallbladder. No obvious stones. No ductal dilatation. Fatty liver infiltration Electronically Signed   By: Karen Kays M.D.   On: 10/06/2022 15:18      Subjective: Patient seen and examined at bedside.  Still complains of significant abdominal pain but tolerating diet and feels okay to go home today.  No fever, chest pain or vomiting reported.  Discharge Exam: Vitals:   10/11/22 0447 10/11/22 0745  BP: 133/84 110/63  Pulse: 83 73  Resp: 18 16  Temp: 98.1 F (36.7 C) 98.4 F (36.9 C)  SpO2: 96% 95%    General: Pt is alert, awake, not in acute distress.  On room air.  Left chronically ill and deconditioned.  Slow to respond.  Flat affect. Cardiovascular: rate controlled, S1/S2 + Respiratory: bilateral decreased breath sounds at bases Abdominal: Soft, still tender in the right side with some ecchymosis and distention;  bowel sounds + Extremities: Mild lower extremity edema, no cyanosis    The results of significant diagnostics from this hospitalization (including imaging, microbiology, ancillary and laboratory) are listed below for reference.     Microbiology: No results found for this or any previous visit (from the past 240 hour(s)).    Labs: BNP (last 3 results) No results for input(s): "BNP" in the last 8760 hours. Basic Metabolic Panel: Recent Labs  Lab 10/06/22 2352 10/07/22 1134 10/08/22 0610 10/09/22 0618 10/10/22 0559  NA 131* 135 134* 135 135  K 4.1 3.8 3.7 3.6 3.5  CL 102 99 100 101 100  CO2 22 24 26 25 25   GLUCOSE 119* 107* 97 97 99  BUN 8 7* 8 8 7*  CREATININE 0.71 0.75 0.79 0.85 0.82  CALCIUM 7.6* 8.0* 8.3* 7.9* 8.1*  MG  --  1.9 2.1 2.1 2.1  PHOS  --  2.8  --   --   --    Liver Function Tests: Recent Labs  Lab 10/06/22 1437 10/07/22 1134  AST 17 15  ALT 19 18  ALKPHOS 87 69  BILITOT 0.7 1.0  PROT 6.6 5.6*  ALBUMIN 3.7 3.2*   Recent Labs  Lab 10/06/22 1437  LIPASE 25   No results for input(s): "AMMONIA" in the last 168 hours. CBC: Recent Labs  Lab 10/06/22 1437 10/06/22 1915 10/06/22 2352 10/07/22 1134 10/08/22 0610 10/09/22 0618 10/10/22 0559  WBC 7.9  --   --  9.4 7.4 6.3 6.5  NEUTROABS 6.0  --   --   --  5.2 4.1 4.5  HGB 12.4*   < > 10.3* 9.9* 9.1* 8.7* 9.1*  HCT 35.5*   < > 29.8* 28.4* 26.2* 25.2* 26.6*  MCV 88.5  --   --  89.3 89.4 87.5 87.2  PLT 225  --   --  199 191 176 208   < > =  values in this interval not displayed.   Cardiac Enzymes: No results for input(s): "CKTOTAL", "CKMB", "CKMBINDEX", "TROPONINI" in the last 168 hours. BNP: Invalid input(s): "POCBNP" CBG: No results for input(s): "GLUCAP" in the last 168 hours. D-Dimer No results for input(s): "DDIMER" in the last 72 hours. Hgb A1c No results for input(s): "HGBA1C" in the last 72 hours. Lipid Profile No results for input(s): "CHOL", "HDL", "LDLCALC", "TRIG", "CHOLHDL", "LDLDIRECT" in the last 72 hours. Thyroid function studies No results for input(s): "TSH", "T4TOTAL", "T3FREE", "THYROIDAB" in the last 72 hours.  Invalid input(s): "FREET3" Anemia work up No results for input(s): "VITAMINB12", "FOLATE", "FERRITIN", "TIBC", "IRON", "RETICCTPCT" in the last 72 hours. Urinalysis    Component  Value Date/Time   COLORURINE YELLOW 10/06/2022 2106   APPEARANCEUR CLEAR 10/06/2022 2106   LABSPEC 1.041 (H) 10/06/2022 2106   PHURINE 7.0 10/06/2022 2106   GLUCOSEU NEGATIVE 10/06/2022 2106   HGBUR NEGATIVE 10/06/2022 2106   BILIRUBINUR NEGATIVE 10/06/2022 2106   KETONESUR NEGATIVE 10/06/2022 2106   PROTEINUR NEGATIVE 10/06/2022 2106   NITRITE NEGATIVE 10/06/2022 2106   LEUKOCYTESUR NEGATIVE 10/06/2022 2106   Sepsis Labs Recent Labs  Lab 10/07/22 1134 10/08/22 0610 10/09/22 0618 10/10/22 0559  WBC 9.4 7.4 6.3 6.5   Microbiology No results found for this or any previous visit (from the past 240 hour(s)).   Time coordinating discharge: 35 minutes  SIGNED:   Glade Lloyd, MD  [facility] 10/11/2022, 9:50 AM

## 2022-10-11 NOTE — Care Management Important Message (Signed)
Important Message  Patient Details  Name: Jim Collins MRN: [id number] Date of Birth: Jan 16, 1951   Medicare Important Message Given:  Yes     [name] 10/11/2022, 3:03 PM

## 2022-10-11 NOTE — Progress Notes (Signed)
Patient is being discharged home. All discharge instructions reviewed with with patient including changes in medications and follow up appointments. Pt verbalized a full understanding with instructions. Also spoke with Fleet Contras, patients daughter in law, she will be his ride home.

## 2022-11-01 ENCOUNTER — Other Ambulatory Visit: Payer: Self-pay | Admitting: *Deleted

## 2022-11-01 DIAGNOSIS — I7779 Dissection of other artery: Secondary | ICD-10-CM

## 2022-11-11 ENCOUNTER — Encounter: Payer: Self-pay | Admitting: Physician Assistant

## 2022-11-11 ENCOUNTER — Ambulatory Visit: Payer: 59 | Admitting: Physician Assistant

## 2022-11-11 ENCOUNTER — Ambulatory Visit (HOSPITAL_COMMUNITY)
Admission: RE | Admit: 2022-11-11 | Discharge: 2022-11-11 | Disposition: A | Discharge status: Home / Self Care | Payer: 59 | Source: Ambulatory Visit | Attending: Vascular Surgery | Admitting: Vascular Surgery

## 2022-11-11 VITALS — BP 155/79 | HR 58 | Temp 97.8°F | Resp 18 | Ht 67.0 in | Wt 182.0 lb

## 2022-11-11 DIAGNOSIS — I7779 Dissection of other artery: Secondary | ICD-10-CM
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
## 2022-11-28 ENCOUNTER — Encounter (HOSPITAL_COMMUNITY): Payer: Self-pay | Admitting: Radiology

## 2022-11-28 ENCOUNTER — Emergency Department (HOSPITAL_COMMUNITY)
Admission: EM | Admit: 2022-11-28 | Discharge: 2022-11-28 | Disposition: A | Discharge status: Home / Self Care | Payer: 59 | Attending: Emergency Medicine | Admitting: Emergency Medicine

## 2022-11-28 ENCOUNTER — Emergency Department (HOSPITAL_COMMUNITY): Payer: 59

## 2022-11-28 ENCOUNTER — Other Ambulatory Visit: Payer: Self-pay

## 2022-11-28 DIAGNOSIS — Z87891 Personal history of nicotine dependence: Secondary | ICD-10-CM | POA: Insufficient documentation

## 2022-11-28 DIAGNOSIS — I10 Essential (primary) hypertension: Secondary | ICD-10-CM | POA: Diagnosis not present

## 2022-11-28 DIAGNOSIS — S301XXD Contusion of abdominal wall, subsequent encounter: Secondary | ICD-10-CM | POA: Diagnosis not present

## 2022-11-28 DIAGNOSIS — X58XXXD Exposure to other specified factors, subsequent encounter: Secondary | ICD-10-CM | POA: Insufficient documentation

## 2022-11-28 DIAGNOSIS — R109 Unspecified abdominal pain: Secondary | ICD-10-CM | POA: Diagnosis present

## 2022-11-28 LAB — COMPREHENSIVE METABOLIC PANEL
ALT: 24 U/L (ref 0–44)
AST: 21 U/L (ref 15–41)
Albumin: 4.3 g/dL (ref 3.5–5.0)
Alkaline Phosphatase: 94 U/L (ref 38–126)
Anion gap: 14 (ref 5–15)
BUN: 16 mg/dL (ref 8–23)
CO2: 21 mmol/L — ABNORMAL LOW (ref 22–32)
Calcium: 9.4 mg/dL (ref 8.9–10.3)
Chloride: 103 mmol/L (ref 98–111)
Creatinine, Ser: 0.68 mg/dL (ref 0.61–1.24)
GFR, Estimated: >60 mL/min (ref >60)
Glucose, Bld: 101 mg/dL — ABNORMAL HIGH (ref 70–99)
Potassium: 3.5 mmol/L (ref 3.5–5.1)
Sodium: 138 mmol/L (ref 135–145)
Total Bilirubin: 0.8 mg/dL (ref 0.3–1.2)
Total Protein: 7.7 g/dL (ref 6.5–8.1)

## 2022-11-28 LAB — CBC WITH DIFFERENTIAL/PLATELET
Abs Immature Granulocytes: 0.06 10*3/uL (ref 0.00–0.07)
Basophils Absolute: 0 10*3/uL (ref 0.0–0.1)
Basophils Relative: 0 %
Eosinophils Absolute: 0 10*3/uL (ref 0.0–0.5)
Eosinophils Relative: 0 %
HCT: 42.3 % (ref 39.0–52.0)
Hemoglobin: 14.4 g/dL (ref 13.0–17.0)
Immature Granulocytes: 1 %
Lymphocytes Relative: 20 %
Lymphs Abs: 2.1 10*3/uL (ref 0.7–4.0)
MCH: 29.1 pg (ref 26.0–34.0)
MCHC: 34 g/dL (ref 30.0–36.0)
MCV: 85.6 fL (ref 80.0–100.0)
Monocytes Absolute: 0.7 10*3/uL (ref 0.1–1.0)
Monocytes Relative: 7 %
Neutro Abs: 7.4 10*3/uL (ref 1.7–7.7)
Neutrophils Relative %: 72 %
Platelets: 228 10*3/uL (ref 150–400)
RBC: 4.94 MIL/uL (ref 4.22–5.81)
RDW: 13.1 % (ref 11.5–15.5)
WBC: 10.3 10*3/uL (ref 4.0–10.5)
nRBC: 0 % (ref 0.0–0.2)

## 2022-11-28 LAB — URINALYSIS, ROUTINE W REFLEX MICROSCOPIC
Bilirubin Urine: NEGATIVE
Glucose, UA: NEGATIVE mg/dL
Hgb urine dipstick: NEGATIVE
Ketones, ur: NEGATIVE mg/dL
Leukocytes,Ua: NEGATIVE
Nitrite: NEGATIVE
Protein, ur: NEGATIVE mg/dL
Specific Gravity, Urine: 1.013 (ref 1.005–1.030)
pH: 7 (ref 5.0–8.0)

## 2022-11-28 LAB — LIPASE, BLOOD: Lipase: 29 U/L (ref 11–51)

## 2022-11-28 MED ORDER — HYDROMORPHONE HCL 1 MG/ML IJ SOLN
0.5000 mg | Freq: Once | INTRAMUSCULAR | Status: AC
  Administered 2022-11-28: 0.5 mg via INTRAVENOUS
  Filled 2022-11-28: qty 0.5

## 2022-11-28 MED ORDER — ACETAMINOPHEN 325 MG PO TABS
650.0000 mg | ORAL_TABLET | Freq: Once | ORAL | Status: AC
  Administered 2022-11-28: 650 mg via ORAL
  Filled 2022-11-28: qty 2

## 2022-11-28 MED ORDER — IOHEXOL 300 MG/ML  SOLN
100.0000 mL | Freq: Once | INTRAMUSCULAR | Status: AC | PRN
  Administered 2022-11-28: 100 mL via INTRAVENOUS
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
## 2022-11-28 NOTE — ED Notes (Signed)
Pt uses spanish interpretor for communication.

## 2022-11-28 NOTE — Discharge Instructions (Signed)
Your scan today showed that the area of bleeding, called a hematoma, on your abdomen is getting smaller.  This is likely still causing some discomfort while it is healing.  You can take Motrin and Tylenol at home for this pain.  Please follow-up with your primary care doctor.  Come back to the emergency department if you develop new, or sudden worsening of your pain.

## 2022-11-28 NOTE — ED Provider Triage Note (Signed)
Emergency Medicine Provider Triage Evaluation Note  Jim Collins , a 72 y.o. male  was evaluated in triage.  Pt complains of abdominal pain in the past 2 days.  Pain is located in the right upper quadrant and right lower quadrant.  Last bowel movement was this morning.  He denies any fever, cold/chills, nausea, vomiting, bowel change, urine symptoms, blood in his stool or urine.  No history of abdominal surgery..  Review of Systems  Positive: As above Negative: As above  Physical Exam  BP (!) 157/90 (BP Location: Right Arm)   Pulse 84   Temp 98.7 F (37.1 C) (Oral)   Resp 17   Ht 5\' 7"  (1.702 m)   Wt 82.6 kg   SpO2 99%   BMI 28.51 kg/m  Gen:   Awake, no distress   Resp:  Normal effort  MSK:   Moves extremities without difficulty  Other:  TTP to RUQ and RLQ.  Medical Decision Making  Medically screening exam initiated at 6:38 PM.  Appropriate orders placed.  Jim Collins was informed that the remainder of the evaluation will be completed by another provider, this initial triage assessment does not replace that evaluation, and the importance of remaining in the ED until their evaluation is complete.     Jeanelle Malling, [place] 11/29/22 [phone]

## 2022-11-28 NOTE — ED Triage Notes (Signed)
Pt states he has pain in his abd for the past three days.Pt states the pain is on his right abd. Pt recently admitted for a GI bleed and transferred to [facility]. Pt made aware of plan to get blood and have provider check him.Pt is spanish speaking only.

## 2022-12-07 ENCOUNTER — Encounter: Payer: Self-pay | Admitting: Physical Medicine & Rehabilitation

## 2022-12-21 ENCOUNTER — Encounter: Payer: 59 | Attending: Physical Medicine & Rehabilitation | Admitting: Physical Medicine & Rehabilitation

## 2022-12-21 ENCOUNTER — Encounter: Payer: Self-pay | Admitting: Physical Medicine & Rehabilitation

## 2022-12-21 VITALS — BP 129/82 | HR 72 | Ht 67.0 in | Wt 183.0 lb

## 2022-12-21 DIAGNOSIS — M545 Low back pain, unspecified: Secondary | ICD-10-CM | POA: Diagnosis present

## 2022-12-21 DIAGNOSIS — G894 Chronic pain syndrome: Secondary | ICD-10-CM | POA: Insufficient documentation

## 2022-12-21 DIAGNOSIS — G8929 Other chronic pain: Secondary | ICD-10-CM | POA: Insufficient documentation

## 2022-12-21 DIAGNOSIS — Z5181 Encounter for therapeutic drug level monitoring: Secondary | ICD-10-CM | POA: Insufficient documentation

## 2022-12-21 DIAGNOSIS — M542 Cervicalgia: Secondary | ICD-10-CM | POA: Diagnosis not present

## 2022-12-21 DIAGNOSIS — M25562 Pain in left knee: Secondary | ICD-10-CM | POA: Insufficient documentation

## 2022-12-21 DIAGNOSIS — Z79899 Other long term (current) drug therapy: Secondary | ICD-10-CM | POA: Insufficient documentation

## 2022-12-21 DIAGNOSIS — M25561 Pain in right knee: Secondary | ICD-10-CM | POA: Insufficient documentation

## 2022-12-21 DIAGNOSIS — F200 Paranoid schizophrenia: Secondary | ICD-10-CM | POA: Insufficient documentation

## 2022-12-21 MED ORDER — DICLOFENAC SODIUM 1 % EX GEL: 4.0000 g | Freq: Four times a day (QID) | CUTANEOUS | 3 refills | Status: DC

## 2022-12-21 MED ORDER — METHOCARBAMOL 750 MG PO TABS: 750.0000 mg | ORAL_TABLET | Freq: Two times a day (BID) | ORAL | 1 refills | Status: DC | PRN
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
## 2022-12-24 LAB — TOXASSURE SELECT,+ANTIDEPR,UR

## 2023-01-17 ENCOUNTER — Encounter: Payer: 59 | Attending: Physical Medicine & Rehabilitation | Admitting: Physical Medicine & Rehabilitation

## 2023-01-17 ENCOUNTER — Encounter: Payer: Self-pay | Admitting: Physical Medicine & Rehabilitation

## 2023-01-17 VITALS — BP 146/88 | HR 58 | Ht 67.0 in | Wt 187.0 lb

## 2023-01-17 DIAGNOSIS — G8929 Other chronic pain: Secondary | ICD-10-CM | POA: Insufficient documentation

## 2023-01-17 DIAGNOSIS — M545 Low back pain, unspecified: Secondary | ICD-10-CM | POA: Diagnosis not present

## 2023-01-17 DIAGNOSIS — M25561 Pain in right knee: Secondary | ICD-10-CM | POA: Insufficient documentation

## 2023-01-17 DIAGNOSIS — M25562 Pain in left knee: Secondary | ICD-10-CM | POA: Insufficient documentation

## 2023-01-17 DIAGNOSIS — F200 Paranoid schizophrenia: Secondary | ICD-10-CM | POA: Diagnosis present

## 2023-01-17 DIAGNOSIS — G894 Chronic pain syndrome: Secondary | ICD-10-CM | POA: Diagnosis not present

## 2023-01-17 DIAGNOSIS — M542 Cervicalgia: Secondary | ICD-10-CM | POA: Diagnosis present

## 2023-01-17 DIAGNOSIS — Z79899 Other long term (current) drug therapy: Secondary | ICD-10-CM | POA: Diagnosis not present

## 2023-01-17 MED ORDER — TRAMADOL HCL 50 MG PO TABS: 50.0000 mg | ORAL_TABLET | Freq: Two times a day (BID) | ORAL | 0 refills | Status: DC | PRN
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
## 2023-02-04 ENCOUNTER — Ambulatory Visit (HOSPITAL_COMMUNITY)
Admission: RE | Admit: 2023-02-04 | Discharge: 2023-02-04 | Disposition: A | Discharge status: Home / Self Care | Payer: 59 | Source: Ambulatory Visit | Attending: Physical Medicine & Rehabilitation | Admitting: Physical Medicine & Rehabilitation

## 2023-02-04 DIAGNOSIS — M545 Low back pain, unspecified: Secondary | ICD-10-CM | POA: Insufficient documentation

## 2023-02-04 DIAGNOSIS — G8929 Other chronic pain: Secondary | ICD-10-CM | POA: Insufficient documentation

## 2023-02-04 DIAGNOSIS — M542 Cervicalgia: Secondary | ICD-10-CM | POA: Insufficient documentation

## 2023-02-25 ENCOUNTER — Encounter: Payer: 59 | Admitting: Physical Medicine & Rehabilitation

## 2023-03-01 ENCOUNTER — Ambulatory Visit: Payer: 59 | Admitting: Physical Medicine & Rehabilitation

## 2023-03-01 ENCOUNTER — Emergency Department (HOSPITAL_COMMUNITY): Payer: 59

## 2023-03-01 ENCOUNTER — Emergency Department (HOSPITAL_COMMUNITY)
Admission: EM | Admit: 2023-03-01 | Discharge: 2023-03-01 | Disposition: A | Discharge status: Home / Self Care | Payer: 59 | Attending: Emergency Medicine | Admitting: Emergency Medicine

## 2023-03-01 ENCOUNTER — Other Ambulatory Visit: Payer: Self-pay

## 2023-03-01 DIAGNOSIS — R1084 Generalized abdominal pain: Secondary | ICD-10-CM | POA: Insufficient documentation

## 2023-03-01 DIAGNOSIS — R1012 Left upper quadrant pain: Secondary | ICD-10-CM | POA: Diagnosis not present

## 2023-03-01 DIAGNOSIS — R1011 Right upper quadrant pain: Secondary | ICD-10-CM | POA: Insufficient documentation

## 2023-03-01 DIAGNOSIS — R1013 Epigastric pain: Secondary | ICD-10-CM | POA: Diagnosis present

## 2023-03-01 LAB — CBC
HCT: 42.1 % (ref 39.0–52.0)
Hemoglobin: 14.9 g/dL (ref 13.0–17.0)
MCH: 31.4 pg (ref 26.0–34.0)
MCHC: 35.4 g/dL (ref 30.0–36.0)
MCV: 88.6 fL (ref 80.0–100.0)
Platelets: 241 10*3/uL (ref 150–400)
RBC: 4.75 MIL/uL (ref 4.22–5.81)
RDW: 12.7 % (ref 11.5–15.5)
WBC: 8.2 10*3/uL (ref 4.0–10.5)
nRBC: 0 % (ref 0.0–0.2)

## 2023-03-01 LAB — COMPREHENSIVE METABOLIC PANEL
ALT: 20 U/L (ref 0–44)
AST: 18 U/L (ref 15–41)
Albumin: 4.5 g/dL (ref 3.5–5.0)
Alkaline Phosphatase: 77 U/L (ref 38–126)
Anion gap: 9 (ref 5–15)
BUN: 13 mg/dL (ref 8–23)
CO2: 24 mmol/L (ref 22–32)
Calcium: 9.2 mg/dL (ref 8.9–10.3)
Chloride: 98 mmol/L (ref 98–111)
Creatinine, Ser: 0.77 mg/dL (ref 0.61–1.24)
GFR, Estimated: >60 mL/min (ref >60)
Glucose, Bld: 100 mg/dL — ABNORMAL HIGH (ref 70–99)
Potassium: 4.5 mmol/L (ref 3.5–5.1)
Sodium: 131 mmol/L — ABNORMAL LOW (ref 135–145)
Total Bilirubin: 0.9 mg/dL (ref 0.0–1.2)
Total Protein: 7.9 g/dL (ref 6.5–8.1)

## 2023-03-01 LAB — LIPASE, BLOOD: Lipase: 28 U/L (ref 11–51)

## 2023-03-01 MED ORDER — IOHEXOL 300 MG/ML  SOLN
100.0000 mL | Freq: Once | INTRAMUSCULAR | Status: AC | PRN
  Administered 2023-03-01: 100 mL via INTRAVENOUS
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
## 2023-03-01 NOTE — ED Provider Notes (Signed)
[place] EMERGENCY DEPARTMENT AT [facility] Provider Note   CSN: [id number] Arrival date & time: 03/01/23  7829     History  Chief Complaint  Patient presents with   Abdominal Pain    Jim Collins is a 73 y.o. male.  73 year old male with history of abdominal wall hematoma presents to the ED with intermittent abdominal pain. Pain is located in the upper abdomen and occasionally radiates to the left lower chest. No clear association with food or fluid intake. He denies fever, chills, nausea, vomiting, or diarrhea. Pain is similar to prior episodes of chronic abdominal discomfort.  The history is provided by medical records and the patient.  Abdominal Pain Pain location:  Epigastric, LUQ and RUQ Pain quality: pressure and squeezing   Pain severity:  Mild Onset quality:  Gradual Timing:  Sporadic Progression:  Waxing and waning Context: not diet changes, not eating, not recent illness and not suspicious food intake   Ineffective treatments:  None tried Associated symptoms: no anorexia, no chest pain, no chills, no constipation, no diarrhea, no dysuria, no nausea, no shortness of breath and no vomiting        Home Medications Prior to Admission medications   Medication Sig Start Date End Date Taking? Authorizing Provider  amLODipine (NORVASC) 5 MG tablet Take 5 mg by mouth daily. 11/04/22   [provider]  atorvastatin (LIPITOR) 20 MG tablet Take 20 mg by mouth daily. 11/04/22   [provider]  diclofenac Sodium (VOLTAREN ARTHRITIS PAIN) 1 % GEL Apply 4 g topically 4 (four) times daily. B/l Knee 12/21/22   Fanny Dance, MD  escitalopram (LEXAPRO) 10 MG tablet Take 10 mg by mouth daily.    [provider]  losartan (COZAAR) 100 MG tablet Take 100 mg by mouth daily. 11/04/22   [provider]  methocarbamol (ROBAXIN-750) 750 MG tablet Take 1 tablet (750 mg total) by mouth 2 (two) times daily as needed for muscle spasms. 12/21/22    Fanny Dance, MD  OLANZapine (ZYPREXA) 15 MG tablet Take 15 mg by mouth at bedtime. 03/10/20   [provider]  ondansetron (ZOFRAN ODT) 4 MG disintegrating tablet Take 1 tablet (4 mg total) by mouth every 6 (six) hours as needed for nausea or vomiting. 04/04/20   Tiffany Kocher, PA-C  oxyCODONE-acetaminophen (PERCOCET/ROXICET) 5-325 MG tablet Take 1 tablet by mouth every 6 (six) hours as needed for severe pain. 10/11/22   Glade Lloyd, MD  pantoprazole (PROTONIX) 40 MG tablet Take one capsule once to twice daily before a meal 11/11/21   Tiffany Kocher, PA-C  polyethylene glycol (MIRALAX MIX-IN PAX) 17 g packet Take 17 grams (one pack) mixed in 4-6 ounces of liquid twice daily until soft bowel movement, then continue once daily thereafter. 04/04/20   Tiffany Kocher, PA-C  sucralfate (CARAFATE) 1 g tablet Take 1 g by mouth in the morning, at noon, and at bedtime. As needed    [provider]  traMADol (ULTRAM) 50 MG tablet Take 1 tablet (50 mg total) by mouth 2 (two) times daily as needed. 01/17/23   Fanny Dance, MD      Allergies    Haloperidol lactate, Morphine and codeine, Compazine [prochlorperazine], and Thorazine [chlorpromazine]    Review of Systems   Review of Systems  Constitutional:  Negative for chills.  Respiratory:  Negative for shortness of breath.   Cardiovascular:  Negative for chest pain.  Gastrointestinal:  Positive for abdominal pain. Negative for  anorexia, constipation, diarrhea, nausea and vomiting.  Genitourinary:  Negative for dysuria.  All other systems reviewed and are negative.   Physical Exam Updated Vital Signs BP (!) 157/96   Pulse 77   Temp 97.9 F (36.6 C) (Oral)   Resp 17   Ht 5\' 6"  (1.676 m)   Wt 87.1 kg   SpO2 99%   BMI 30.99 kg/m  Physical Exam Constitutional:      Appearance: He is well-developed.  HENT:     Head: Normocephalic.  Eyes:     Pupils: Pupils are equal, round, and reactive to light.  Cardiovascular:      Rate and Rhythm: Normal rate and regular rhythm.  Pulmonary:     Effort: Pulmonary effort is normal.     Breath sounds: Normal breath sounds.  Abdominal:     General: Bowel sounds are normal.     Palpations: Abdomen is soft. There is no mass.     Tenderness: There is no abdominal tenderness.  Skin:    General: Skin is warm and dry.  Neurological:     Mental Status: He is alert and oriented to person, place, and time.  Psychiatric:        Mood and Affect: Mood normal.        Behavior: Behavior normal.     ED Results / Procedures / Treatments   Labs (all labs ordered are listed, but only abnormal results are displayed) Labs Reviewed  COMPREHENSIVE METABOLIC PANEL - Abnormal; Notable for the following components:      Result Value   Sodium 131 (*)    Glucose, Bld 100 (*)    All other components within normal limits  LIPASE, BLOOD  CBC  URINALYSIS, ROUTINE W REFLEX MICROSCOPIC    EKG None  Radiology CT ABDOMEN PELVIS W CONTRAST Result Date: 03/01/2023 CLINICAL DATA:  Abdominal pain. EXAM: CT ABDOMEN AND PELVIS WITH CONTRAST TECHNIQUE: Multidetector CT imaging of the abdomen and pelvis was performed using the standard protocol following bolus administration of intravenous contrast. RADIATION DOSE REDUCTION: This exam was performed according to the departmental dose-optimization program which includes automated exposure control, adjustment of the mA and/or kV according to patient size and/or use of iterative reconstruction technique. CONTRAST:  OMNIPAQUE IOHEXOL 300 MG/ML  SOLN COMPARISON:  CT abdomen pelvis dated 11/28/2022. FINDINGS: Lower chest: The visualized lung bases are clear. No intra-abdominal free air or free fluid. Hepatobiliary: The liver is unremarkable. No biliary dilatation. The gallbladder is unremarkable. Pancreas: Unremarkable. No pancreatic ductal dilatation or surrounding inflammatory changes. Spleen: Normal in size without focal abnormality.  Adrenals/Urinary Tract: The adrenal glands are unremarkable. Small right renal inferior pole cyst. There is no hydronephrosis on either side. There is symmetric enhancement and excretion of contrast by both kidneys. The visualized ureters appear unremarkable. There is mild trabeculated appearance of the bladder wall. Stomach/Bowel: Several small scattered colonic diverticula. There is no bowel obstruction or active inflammation. The appendix is normal. Vascular/Lymphatic: Mild aortoiliac atherosclerotic disease. The IVC is unremarkable. No portal venous gas. There is no adenopathy. Reproductive: The prostate and seminal vesicles are grossly unremarkable. No pelvic mass. Other: None Musculoskeletal: Degenerative changes of the spine. No acute osseous pathology. IMPRESSION: 1. No acute intra-abdominal or pelvic pathology. 2. Colonic diverticulosis. No bowel obstruction. Normal appendix. 3.  Aortic Atherosclerosis (ICD10-I70.0). Electronically Signed   By: Elgie Collard M.D.   On: 03/01/2023 13:33    Procedures Procedures    Medications Ordered in ED Medications  iohexol (OMNIPAQUE) 300  MG/ML solution 100 mL (100 mLs Intravenous Contrast Given 03/01/23 1303)    ED Course/ Medical Decision Making/ A&P                                 Medical Decision Making Amount and/or Complexity of Data Reviewed Labs: ordered.   CBC/CMET/lipase without acute abnormality. No acute findings on CT. Chronic diverticulosis. Prior abdominal hematoma apparently has resolved.  Patient is nontoxic, nonseptic appearing, in no apparent distress. Patient is not currently in pain. Labs, imaging and vitals reviewed.  Patient does not meet the SIRS or Sepsis criteria.  On repeat exam patient does not have a surgical abdomin and there are no peritoneal signs.  No indication of appendicitis, bowel obstruction, bowel perforation, cholecystitis, diverticulitis. Chronic diverticulosis.  Patient discharged home with symptomatic  treatment and given strict instructions for follow-up with their primary care physician.  I have also discussed reasons to return immediately to the ER.  Patient expresses understanding and agrees with plan.          Final Clinical Impression(s) / ED Diagnoses Final diagnoses:  Generalized abdominal pain    Rx / DC Orders ED Discharge Orders     None         Felicie Morn, NP 03/01/23 Arletha Grippe    Cathren Laine, MD 03/02/23 1243

## 2023-03-01 NOTE — ED Triage Notes (Signed)
Pt brought in by [facility] from home with c/o abdominal pain that started at 0700 this morning. Denies n/v/d.

## 2023-03-01 NOTE — ED Provider Triage Note (Signed)
Emergency Medicine Provider Triage Evaluation Note  Jim Collins , a 72 y.o. male  was evaluated in triage.  Pt complains of abdominal pain  Review of Systems  Positive: pain Negative: fever  Physical Exam  BP (!) 157/96   Pulse 77   Temp 97.9 F (36.6 C) (Oral)   Resp 17   Ht 5\' 6"  (1.676 m)   Wt 87.1 kg   SpO2 99%   BMI 30.99 kg/m  Gen:   Awake, no distress   Resp:  Normal effort  MSK:   Moves extremities without difficulty  Other:    Medical Decision Making  Medically screening exam initiated at 12:33 PM.  Appropriate orders placed.  Jim Collins was informed that the remainder of the evaluation will be completed by another provider, this initial triage assessment does not replace that evaluation, and the importance of remaining in the ED until their evaluation is complete.     Elson Areas, [place] 03/01/23 1233

## 2023-03-01 NOTE — Discharge Instructions (Addendum)
Please refer to the attached instructions for return precautions.. Tylenol as needed for discomfort. Follow up with your primary care provider.

## 2023-07-27 ENCOUNTER — Other Ambulatory Visit: Payer: Self-pay

## 2023-07-27 ENCOUNTER — Emergency Department (HOSPITAL_COMMUNITY)
Admission: EM | Admit: 2023-07-27 | Discharge: 2023-07-27 | Disposition: A | Discharge status: Home / Self Care | Attending: Emergency Medicine | Admitting: Emergency Medicine

## 2023-07-27 ENCOUNTER — Emergency Department (HOSPITAL_COMMUNITY)

## 2023-07-27 DIAGNOSIS — Z79899 Other long term (current) drug therapy: Secondary | ICD-10-CM | POA: Diagnosis not present

## 2023-07-27 DIAGNOSIS — R1012 Left upper quadrant pain: Secondary | ICD-10-CM | POA: Diagnosis present

## 2023-07-27 DIAGNOSIS — M7981 Nontraumatic hematoma of soft tissue: Secondary | ICD-10-CM | POA: Diagnosis not present

## 2023-07-27 DIAGNOSIS — S301XXD Contusion of abdominal wall, subsequent encounter: Secondary | ICD-10-CM

## 2023-07-27 LAB — CBC
HCT: 35.3 % — ABNORMAL LOW (ref 39.0–52.0)
Hemoglobin: 12.3 g/dL — ABNORMAL LOW (ref 13.0–17.0)
MCH: 30.4 pg (ref 26.0–34.0)
MCHC: 34.8 g/dL (ref 30.0–36.0)
MCV: 87.2 fL (ref 80.0–100.0)
Platelets: 246 10*3/uL (ref 150–400)
RBC: 4.05 MIL/uL — ABNORMAL LOW (ref 4.22–5.81)
RDW: 12.4 % (ref 11.5–15.5)
WBC: 9 10*3/uL (ref 4.0–10.5)
nRBC: 0 % (ref 0.0–0.2)

## 2023-07-27 LAB — COMPREHENSIVE METABOLIC PANEL WITH GFR
ALT: 20 U/L (ref 0–44)
AST: 21 U/L (ref 15–41)
Albumin: 3.9 g/dL (ref 3.5–5.0)
Alkaline Phosphatase: 103 U/L (ref 38–126)
Anion gap: 9 (ref 5–15)
BUN: 8 mg/dL (ref 8–23)
CO2: 23 mmol/L (ref 22–32)
Calcium: 9 mg/dL (ref 8.9–10.3)
Chloride: 103 mmol/L (ref 98–111)
Creatinine, Ser: 0.72 mg/dL (ref 0.61–1.24)
GFR, Estimated: >60 mL/min (ref >60)
Glucose, Bld: 110 mg/dL — ABNORMAL HIGH (ref 70–99)
Potassium: 4.1 mmol/L (ref 3.5–5.1)
Sodium: 135 mmol/L (ref 135–145)
Total Bilirubin: 0.7 mg/dL (ref 0.0–1.2)
Total Protein: 7 g/dL (ref 6.5–8.1)

## 2023-07-27 LAB — URINALYSIS, ROUTINE W REFLEX MICROSCOPIC
Bilirubin Urine: NEGATIVE
Glucose, UA: NEGATIVE mg/dL
Hgb urine dipstick: NEGATIVE
Ketones, ur: NEGATIVE mg/dL
Leukocytes,Ua: NEGATIVE
Nitrite: NEGATIVE
Protein, ur: NEGATIVE mg/dL
Specific Gravity, Urine: 1.009 (ref 1.005–1.030)
pH: 7 (ref 5.0–8.0)

## 2023-07-27 LAB — LIPASE, BLOOD: Lipase: 26 U/L (ref 11–51)

## 2023-07-27 MED ORDER — OXYCODONE HCL 5 MG PO TABS: 5.0000 mg | ORAL_TABLET | ORAL | 0 refills | Status: DC | PRN

## 2023-07-27 MED ORDER — ONDANSETRON HCL 4 MG/2ML IJ SOLN
4.0000 mg | Freq: Once | INTRAMUSCULAR | Status: AC
  Administered 2023-07-27: 4 mg via INTRAVENOUS
  Filled 2023-07-27: qty 2

## 2023-07-27 MED ORDER — IOHEXOL 300 MG/ML  SOLN
100.0000 mL | Freq: Once | INTRAMUSCULAR | Status: AC | PRN
  Administered 2023-07-27: 100 mL via INTRAVENOUS

## 2023-07-27 MED ORDER — HYDROMORPHONE HCL 1 MG/ML IJ SOLN
0.5000 mg | Freq: Once | INTRAMUSCULAR | Status: AC
  Administered 2023-07-27: 0.5 mg via INTRAVENOUS
  Filled 2023-07-27: qty 0.5

## 2023-07-27 NOTE — ED Provider Notes (Signed)
 [place] EMERGENCY DEPARTMENT AT [facility] Provider Note   CSN: [id number] Arrival date & time: 07/27/23  9168     Patient presents with: Abdominal Pain   Mansoor Hillyard is a 73 y.o. male.   73 year old male with history of rectus sheath hematoma presents emergency department with abdominal pain.  Patient reports that for the past 3 days he has been having abdominal pain in his left upper quadrant.  Feels similar to prior presentations with his rectus sheath hematoma.  No trauma.  Not on blood thinners.  Denies any fevers, nausea or vomiting, constipation, or urinary frequency.  History obtained via Spanish interpreter.       Prior to Admission medications   Medication Sig Start Date End Date Taking? Authorizing Provider  oxyCODONE  (ROXICODONE ) 5 MG immediate release tablet Take 1 tablet (5 mg total) by mouth every 4 (four) hours as needed for severe pain (pain score 7-10). 07/27/23  Yes Yolande Lamar BROCKS, MD  amLODipine (NORVASC) 5 MG tablet Take 5 mg by mouth daily. 11/04/22   [provider]  atorvastatin  (LIPITOR) 20 MG tablet Take 20 mg by mouth daily. 11/04/22   [provider]  diclofenac  Sodium (VOLTAREN  ARTHRITIS PAIN) 1 % GEL Apply 4 g topically 4 (four) times daily. B/l Knee 12/21/22   Urbano Albright, MD  escitalopram  (LEXAPRO ) 10 MG tablet Take 10 mg by mouth daily.    [provider]  losartan  (COZAAR ) 100 MG tablet Take 100 mg by mouth daily. 11/04/22   [provider]  methocarbamol  (ROBAXIN -750) 750 MG tablet Take 1 tablet (750 mg total) by mouth 2 (two) times daily as needed for muscle spasms. 12/21/22   Urbano Albright, MD  OLANZapine  (ZYPREXA ) 15 MG tablet Take 15 mg by mouth at bedtime. 03/10/20   [provider]  ondansetron  (ZOFRAN  ODT) 4 MG disintegrating tablet Take 1 tablet (4 mg total) by mouth every 6 (six) hours as needed for nausea or vomiting. 04/04/20   Ezzard Sonny RAMAN, PA-C  oxyCODONE -acetaminophen   (PERCOCET/ROXICET) 5-325 MG tablet Take 1 tablet by mouth every 6 (six) hours as needed for severe pain. 10/11/22   Cheryle Page, MD  pantoprazole  (PROTONIX ) 40 MG tablet Take one capsule once to twice daily before a meal 11/11/21   Ezzard Sonny RAMAN, PA-C  polyethylene glycol (MIRALAX  MIX-IN PAX) 17 g packet Take 17 grams (one pack) mixed in 4-6 ounces of liquid twice daily until soft bowel movement, then continue once daily thereafter. 04/04/20   Ezzard Sonny RAMAN, PA-C  sucralfate  (CARAFATE ) 1 g tablet Take 1 g by mouth in the morning, at noon, and at bedtime. As needed    [provider]  traMADol  (ULTRAM ) 50 MG tablet Take 1 tablet (50 mg total) by mouth 2 (two) times daily as needed. 01/17/23   Urbano Albright, MD    Allergies: Haloperidol lactate, Morphine  and codeine, Compazine [prochlorperazine], and Thorazine [chlorpromazine]    Review of Systems  Updated Vital Signs BP (!) 130/103   Pulse 80   Temp 98.4 F (36.9 C) (Oral)   Resp 17   Ht 5' 7 (1.702 m)   Wt 83.9 kg   SpO2 96%   BMI 28.98 kg/m   Physical Exam Vitals and nursing note reviewed.  Constitutional:      General: He is not in acute distress.    Appearance: He is well-developed.  HENT:     Head: Normocephalic and atraumatic.     Right Ear: External ear normal.  Left Ear: External ear normal.     Nose: Nose normal.   Eyes:     Extraocular Movements: Extraocular movements intact.     Conjunctiva/sclera: Conjunctivae normal.     Pupils: Pupils are equal, round, and reactive to light.   Pulmonary:     Effort: Pulmonary effort is normal. No respiratory distress.  Abdominal:     General: There is no distension.     Palpations: Abdomen is soft. There is no mass.     Tenderness: There is abdominal tenderness (Left mid abdomen). There is no right CVA tenderness, left CVA tenderness or guarding.   Musculoskeletal:     Cervical back: Normal range of motion and neck supple.     Right lower leg: No edema.      Left lower leg: No edema.   Skin:    General: Skin is warm and dry.     Comments: Small amount of old appearing ecchymoses over her upper abdomen   Neurological:     Mental Status: He is alert. Mental status is at baseline.   Psychiatric:        Mood and Affect: Mood normal.        Behavior: Behavior normal.     (all labs ordered are listed, but only abnormal results are displayed) Labs Reviewed  COMPREHENSIVE METABOLIC PANEL WITH GFR - Abnormal; Notable for the following components:      Result Value   Glucose, Bld 110 (*)    All other components within normal limits  CBC - Abnormal; Notable for the following components:   RBC 4.05 (*)    Hemoglobin 12.3 (*)    HCT 35.3 (*)    All other components within normal limits  LIPASE, BLOOD  URINALYSIS, ROUTINE W REFLEX MICROSCOPIC    EKG: None  Radiology: CT ABDOMEN PELVIS W CONTRAST Result Date: 07/27/2023 CLINICAL DATA:  Abdominal pain for 3 days. Bruising in the abdomen without injury. EXAM: CT ABDOMEN AND PELVIS WITH CONTRAST TECHNIQUE: Multidetector CT imaging of the abdomen and pelvis was performed using the standard protocol following bolus administration of intravenous contrast. RADIATION DOSE REDUCTION: This exam was performed according to the departmental dose-optimization program which includes automated exposure control, adjustment of the mA and/or kV according to patient size and/or use of iterative reconstruction technique. CONTRAST:  OMNIPAQUE  IOHEXOL  300 MG/ML  SOLN COMPARISON:  03/01/2023 FINDINGS: Lower chest: Left anterior descending coronary artery atherosclerosis. Mild aortic valve calcifications. Mild to moderate cardiomegaly. Postoperative findings along the right lung base. 3 mm calcified granuloma in the right lower lobe on image 4 series 3. Hepatobiliary: Suspected hepatic steatosis.  Otherwise unremarkable. Pancreas: Unremarkable Spleen: Unremarkable Adrenals/Urinary Tract: 1.6 cm benign right kidney  lower pole cyst on image 48 series 2. No further imaging workup of this lesion is indicated. There is some contrast medium in the renal collecting systems and ureters, presumably related to a test injection or early excretion. Adrenal glands unremarkable. Stomach/Bowel: Unremarkable.  Normal appendix. Vascular/Lymphatic: Atheromatous plaque dorsally at the celiac trunk and SMA origins without occlusion or critical stenosis at this time. 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 Reproductive: Benign prostatic hypertrophy. Other: No supplemental non-categorized findings. Musculoskeletal: Left rectus sheath hematoma, hematoma volume estimated at 840 cc. There is some internal heterogeneity from blood products but I do not see findings of active bleeding. Inflammation related to this hematoma also extends along the left lateral abdominal wall musculature which is also mildly expanded,  and there is overlying subcutaneous edema/bruising. Thoracic and lumbar spondylosis and degenerative disc disease causing suspected impingement at L3-4, L4-5, and L5-S1. Moderate degenerative arthropathy of both hips. IMPRESSION: 1. Left rectus sheath hematoma, hematoma volume estimated at 840 cc. There is some internal heterogeneity from blood products but I do not see findings of active bleeding. Inflammation related to this hematoma also extends along the left lateral abdominal wall musculature which is also mildly expanded, and there is overlying subcutaneous edema/bruising. 2. Suspected hepatic steatosis. 3. Benign prostatic hypertrophy. 4. Thoracic and lumbar spondylosis and degenerative disc disease causing suspected impingement at L3-4, L4-5, and L5-S1. 5. Moderate degenerative arthropathy of both hips. 6. 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 7.  Aortic Atherosclerosis (ICD10-I70.0). Electronically Signed   By: Ryan Salvage M.D.   On: 07/27/2023 11:24     Procedures    Medications Ordered in the ED  HYDROmorphone  (DILAUDID ) injection 0.5 mg (0.5 mg Intravenous Given 07/27/23 1002)  ondansetron  (ZOFRAN ) injection 4 mg (4 mg Intravenous Given 07/27/23 1001)  iohexol  (OMNIPAQUE ) 300 MG/ML solution 100 mL (100 mLs Intravenous Contrast Given 07/27/23 1032)                                    Medical Decision Making Amount and/or Complexity of Data Reviewed Labs: ordered. Radiology: ordered.  Risk Prescription drug management.   73 year old male with history of rectus sheath hematoma presents emergency department abdominal pain and bruising  Initial Ddx:  Rectus sheath hematoma, anemia, hemorrhagic shock, SBO, ileus, pancreatitis  MDM/Course:  Patient presents emergency department with abdominal pain for 3 days.  Has a history of rectus sheath hematoma and says it feels similar.  On exam does have some bruising.  He is hemodynamically stable.  Lab work was reassuring and it appears that his hemoglobin is 12 today.  Had a CT scan which shows the persistent rectus sheath hematoma.  No active extravasation.  No internal bleeding or other abnormalities.  He is not on blood thinners currently and low concern that he will decompensate if he goes home.  He was given an abdominal binder and instructed to follow-up with general surgery regarding his rectus sheath hematoma that is likely causing his symptoms.  Family was also updated.    This patient presents to the ED for concern of complaints listed in HPI, this involves an extensive number of treatment options, and is a complaint that carries with it a high risk of complications and morbidity. Disposition including potential need for admission considered.   Dispo: DC Home. Return precautions discussed including, but not limited to, those listed in the AVS. Allowed pt time to ask questions which were answered fully prior to dc.  Additional history obtained from family Records reviewed Outpatient Clinic Notes The  following labs were independently interpreted: Chemistry and show no acute abnormality I independently reviewed the following imaging with scope of interpretation limited to determining acute life threatening conditions related to emergency care: CT Abdomen/Pelvis and agree with the radiologist interpretation with the following exceptions: none I personally reviewed and interpreted cardiac monitoring: normal sinus rhythm  I personally reviewed and interpreted the pt's EKG: see above for interpretation  I have reviewed the patients home medications and made adjustments as needed Social Determinants of health:  Geriatric, spanish speaking  Portions of this note were generated with Scientist, clinical (histocompatibility and immunogenetics). Dictation errors may occur despite best attempts at proofreading.  Final diagnoses:  Hematoma of rectus sheath, subsequent encounter    ED Discharge Orders          Ordered    oxyCODONE  (ROXICODONE ) 5 MG immediate release tablet  Every 4 hours PRN        07/27/23 1142               Yolande Lamar BROCKS, MD 07/27/23 1624

## 2023-07-27 NOTE — ED Notes (Signed)
 EDP at bedside during triage

## 2023-07-27 NOTE — ED Notes (Signed)
 Materials is aware for the need of a binder.

## 2023-07-27 NOTE — Discharge Instructions (Addendum)
 Un hematoma de la vaina del recto abdominal es una acumulacin de sangre dentro de la vaina que rodea el msculo recto abdominal. Esta afeccin es relativamente poco comn, pero puede causar dolor abdominal significativo y diagnosticarse errneamente como otros trastornos abdominales.  Causas: Traumatismo: Un traumatismo directo o indirecto en el abdomen puede provocar un hematoma de la vaina del recto abdominal.  Terapia anticoagulante: Los pacientes que toman anticoagulantes tienen un mayor riesgo debido a la alteracin de la coagulacin sangunea.  Ejercicio extenuante o tos: Las contracciones musculares repentinas y fuertes pueden provocar la ruptura del msculo recto abdominal o de los vasos epigstricos.  Embarazo: Los factores relacionados con Firefighter tambin pueden aumentar el riesgo de hematoma de la vaina del recto abdominal.  Otros factores: La hipertensin, la obesidad, la ciruga abdominal previa y Materials engineer afecciones mdicas tambin pueden contribuir.  Sntomas: Dolor abdominal: Este es el sntoma ms comn, que puede aparecer de forma repentina o gradual. Masa abdominal palpable: Se puede palpar un bulto o hinchazn en el abdomen, correspondiente a la ubicacin del hematoma. Hematomas: Pueden aparecer hematomas visibles en la piel de la zona afectada. Otros sntomas: Tambin pueden presentarse nuseas, vmitos, fiebre y signos de shock hipovolmico (si el hematoma es grande). Diagnstico: Examen fsico: El Regulatory affairs officer los sntomas del paciente y Education officer, environmental un examen fsico para evaluar la presencia de una masa palpable u otros signos. Imagenologa: VickyBETHA Clint vicky puede ayudar a visualizar el hematoma dentro de la vaina del recto. Tomografa computarizada: La tomografa computarizada (TC) se considera el mtodo de referencia para el diagnstico de hematomas de la vaina del recto, ya que puede mostrar claramente la extensin y la ubicacin del  hematoma. Tratamiento: Tratamiento conservador: Clint forts de los hematomas de la vaina del recto se resuelven por s solos con Psychologist, occupational, que incluye: Manejo del dolor (analgsicos de venta libre). Reposo y Optician, dispensing extenuantes. Hielo o compresas fras en la zona afectada. Vigilancia para detectar cualquier signo de empeoramiento o complicaciones. Otros tratamientos: Angioembolizacin: En algunos casos, puede ser necesario un procedimiento mnimamente invasivo llamado angioembolizacin para detener el sangrado si no se controla con medidas conservadoras. Ciruga: Puede ser necesaria una intervencin quirrgica en caso de hematomas grandes o inestables, o si otros tratamientos no son eficaces.  Por favor, consulte con su mdico de cabecera y los [place]. Y use la faja abdominal que le dieron para evitar que empeore. Use Tylenol  para el dolor. Tambin puede usar oxicodona para el dolor irruptivo. Aplique hielo en el abdomen de forma intermitente durante uno o [facility] para limitar la inflamacin.  ---  A rectus sheath hematoma is a collection of blood within the sheath surrounding the rectus abdominis muscle. This condition is relatively uncommon, but it can be a source of significant abdominal pain and may be misdiagnosed as other abdominal disorders.  Causes: Trauma: Direct or indirect trauma to the abdomen can lead to a rectus sheath hematoma.  Anticoagulant Therapy: Patients on blood thinners are at higher risk due to impaired blood clotting.  Strenuous Exercise or Coughing: Sudden, forceful muscle contractions can cause the rectus abdominis muscle or epigastric vessels to rupture.  Pregnancy: Pregnancy-related factors can also increase the risk of rectus sheath hematoma.  Other Factors: Hypertension, obesity, previous abdominal surgery, and certain medical conditions can also contribute.  Symptoms: Abdominal pain: This is the most common symptom,  which can be sudden or gradual in onset.  Palpable abdominal mass: A lump or swelling may be felt  in the abdomen, corresponding to the location of the hematoma.  Bruising: Visible bruising may appear on the skin over the affected area.  Other symptoms: Nausea, vomiting, fever, and signs of hypovolemic shock (if the hematoma is large) can also occur.  Diagnosis: Physical Examination: A doctor can assess the patient's symptoms and perform a physical exam to evaluate for a palpable mass or other signs.  Imaging: Ultrasound: An ultrasound can help visualize the hematoma within the rectus sheath.  CT scan: Computed tomography (CT) is considered the gold standard for diagnosing rectus sheath hematomas, as it can clearly show the extent and location of the hematoma.  Treatment: Conservative Management: Most rectus sheath hematomas resolve on their own with conservative treatment, including: Pain management (over-the-counter pain relievers). Rest and avoiding strenuous activity. Ice or cold compresses to the affected area. Monitoring for any signs of worsening or complications. Other Treatments: Angioembolization: In some cases, a minimally invasive procedure called angioembolization may be needed to stop the bleeding if it is not controlled by conservative measures. Surgery: Surgical intervention may be required for large or unstable hematomas or if other treatments are not effective.   Please follow-up with your primary doctor and the surgeons about this. And wear the abdominal binder you were given to prevent it from worsening. Use tylenol  for your pain. You may also use the oxycodone  for breakthrough pain. Ice your abdomen intermittently for the next day or two to limit the swelling.

## 2023-07-27 NOTE — ED Triage Notes (Signed)
 Pt  arrived [facility] fro c/o abd pain x 3 days and bruising noted to abd without injury noted. No n/v/d noted.

## 2023-07-30 ENCOUNTER — Encounter (HOSPITAL_COMMUNITY): Payer: Self-pay

## 2023-07-30 ENCOUNTER — Emergency Department (HOSPITAL_COMMUNITY)
Admission: EM | Admit: 2023-07-30 | Discharge: 2023-07-30 | Disposition: A | Discharge status: Home / Self Care | Attending: Emergency Medicine | Admitting: Emergency Medicine

## 2023-07-30 ENCOUNTER — Other Ambulatory Visit: Payer: Self-pay

## 2023-07-30 DIAGNOSIS — R1084 Generalized abdominal pain: Secondary | ICD-10-CM | POA: Diagnosis present

## 2023-07-30 DIAGNOSIS — X58XXXD Exposure to other specified factors, subsequent encounter: Secondary | ICD-10-CM | POA: Insufficient documentation

## 2023-07-30 DIAGNOSIS — S301XXD Contusion of abdominal wall, subsequent encounter: Secondary | ICD-10-CM | POA: Insufficient documentation

## 2023-07-30 DIAGNOSIS — R1010 Upper abdominal pain, unspecified: Secondary | ICD-10-CM

## 2023-07-30 LAB — COMPREHENSIVE METABOLIC PANEL WITH GFR
ALT: 21 U/L (ref 0–44)
AST: 20 U/L (ref 15–41)
Albumin: 3.8 g/dL (ref 3.5–5.0)
Alkaline Phosphatase: 101 U/L (ref 38–126)
Anion gap: 10 (ref 5–15)
BUN: 10 mg/dL (ref 8–23)
CO2: 24 mmol/L (ref 22–32)
Calcium: 9.1 mg/dL (ref 8.9–10.3)
Chloride: 101 mmol/L (ref 98–111)
Creatinine, Ser: 0.58 mg/dL — ABNORMAL LOW (ref 0.61–1.24)
GFR, Estimated: >60 mL/min (ref >60)
Glucose, Bld: 101 mg/dL — ABNORMAL HIGH (ref 70–99)
Potassium: 4 mmol/L (ref 3.5–5.1)
Sodium: 135 mmol/L (ref 135–145)
Total Bilirubin: 0.9 mg/dL (ref 0.0–1.2)
Total Protein: 7.2 g/dL (ref 6.5–8.1)

## 2023-07-30 LAB — CBC WITH DIFFERENTIAL/PLATELET
Abs Immature Granulocytes: 0.04 10*3/uL (ref 0.00–0.07)
Basophils Absolute: 0 10*3/uL (ref 0.0–0.1)
Basophils Relative: 0 %
Eosinophils Absolute: 0 10*3/uL (ref 0.0–0.5)
Eosinophils Relative: 0 %
HCT: 33.9 % — ABNORMAL LOW (ref 39.0–52.0)
Hemoglobin: 11.9 g/dL — ABNORMAL LOW (ref 13.0–17.0)
Immature Granulocytes: 1 %
Lymphocytes Relative: 14 %
Lymphs Abs: 1.2 10*3/uL (ref 0.7–4.0)
MCH: 31.3 pg (ref 26.0–34.0)
MCHC: 35.1 g/dL (ref 30.0–36.0)
MCV: 89.2 fL (ref 80.0–100.0)
Monocytes Absolute: 0.6 10*3/uL (ref 0.1–1.0)
Monocytes Relative: 6 %
Neutro Abs: 7.1 10*3/uL (ref 1.7–7.7)
Neutrophils Relative %: 79 %
Platelets: 257 10*3/uL (ref 150–400)
RBC: 3.8 MIL/uL — ABNORMAL LOW (ref 4.22–5.81)
RDW: 12.5 % (ref 11.5–15.5)
WBC: 8.9 10*3/uL (ref 4.0–10.5)
nRBC: 0 % (ref 0.0–0.2)

## 2023-07-30 MED ORDER — HYDROMORPHONE HCL 1 MG/ML IJ SOLN
1.0000 mg | Freq: Once | INTRAMUSCULAR | Status: AC
  Administered 2023-07-30: 1 mg via INTRAMUSCULAR
  Filled 2023-07-30: qty 1

## 2023-07-30 MED ORDER — HYDROMORPHONE HCL 4 MG PO TABS: ORAL_TABLET | ORAL | 0 refills | Status: DC

## 2023-07-30 MED ORDER — ONDANSETRON 4 MG PO TBDP
4.0000 mg | ORAL_TABLET | Freq: Once | ORAL | Status: AC
  Administered 2023-07-30: 4 mg via ORAL
  Filled 2023-07-30: qty 1

## 2023-07-30 MED ORDER — OXYCODONE-ACETAMINOPHEN 5-325 MG PO TABS
1.0000 | ORAL_TABLET | Freq: Once | ORAL | Status: AC
  Administered 2023-07-30: 1 via ORAL
  Filled 2023-07-30: qty 1

## 2023-07-30 MED ORDER — HYDROMORPHONE HCL 4 MG PO TABS: ORAL_TABLET | ORAL | 0 refills | Status: AC

## 2023-07-30 NOTE — Discharge Instructions (Signed)
 Follow-up with general surgery as instructed last visit

## 2023-07-30 NOTE — ED Triage Notes (Signed)
 Brought in by [facility] from home with abdominal pain 10/10 for about 6 days, Was recently at [facility] 3 days ago for abdominal pain and was using a abdominal binder with no relief  in pain. Abdomen bruised . VS per EMS 151/88 HR 81, 97 % RA, Patient alert and oriented on arrival. Denies shortness of breath.

## 2023-07-30 NOTE — ED Notes (Signed)
 Pt family member called stating the medication that was sent to [facility] cannot be filled due to medication being on back order. Family requesting prescription be sent back to [facility] pharmacy. Dr. Towana made aware.

## 2023-07-30 NOTE — ED Notes (Signed)
EDP in room during triage

## 2023-07-30 NOTE — ED Notes (Signed)
 Spoke to [place], explained discharge instructions. Verbalized understanding. Will be picking patient up shortly. Understood patient not being admitted and to follow up with surgery appt on July 22nd and may call them Monday to see if can get in sooner. Dilaudid  po for pain sent to [facility] for pain management.

## 2023-08-01 NOTE — ED Provider Notes (Signed)
 [place] EMERGENCY DEPARTMENT AT [facility] Provider Note   CSN: [id number] Arrival date & time: 07/30/23  9093     Patient presents with: Abdominal Pain   Jim Collins is a 73 y.o. male.   Patient has an abdominal wall hematoma that he has had evaluated with CT recently he is supposed to take pain medicine and follow-up with general surgery.  Patient has run out of his pain medicine and is complaining of abdominal pain  The history is provided by the patient and medical records. No language interpreter was used.  Abdominal Pain Pain location:  Generalized Pain quality: aching   Pain radiates to:  L shoulder Pain severity:  Moderate Onset quality:  Sudden Timing:  Constant Progression:  Waxing and waning Chronicity:  Recurrent Associated symptoms: no chest pain, no cough, no diarrhea, no fatigue and no hematuria        Prior to Admission medications   Medication Sig Start Date End Date Taking? Authorizing Provider  amLODipine (NORVASC) 5 MG tablet Take 5 mg by mouth daily. 11/04/22   [provider]  atorvastatin  (LIPITOR) 20 MG tablet Take 20 mg by mouth daily. 11/04/22   [provider]  diclofenac  Sodium (VOLTAREN  ARTHRITIS PAIN) 1 % GEL Apply 4 g topically 4 (four) times daily. B/l Knee 12/21/22   Urbano Albright, MD  escitalopram  (LEXAPRO ) 10 MG tablet Take 10 mg by mouth daily.    [provider]  HYDROmorphone  (DILAUDID ) 4 MG tablet Take 1 every 6 hours for pain not relieved by Tylenol  alone 07/30/23   Towana Ozell BROCKS, MD  losartan  (COZAAR ) 100 MG tablet Take 100 mg by mouth daily. 11/04/22   [provider]  methocarbamol  (ROBAXIN -750) 750 MG tablet Take 1 tablet (750 mg total) by mouth 2 (two) times daily as needed for muscle spasms. 12/21/22   Urbano Albright, MD  OLANZapine  (ZYPREXA ) 15 MG tablet Take 15 mg by mouth at bedtime. 03/10/20   [provider]  ondansetron  (ZOFRAN  ODT) 4 MG disintegrating tablet  Take 1 tablet (4 mg total) by mouth every 6 (six) hours as needed for nausea or vomiting. 04/04/20   Ezzard Sonny RAMAN, PA-C  oxyCODONE  (ROXICODONE ) 5 MG immediate release tablet Take 1 tablet (5 mg total) by mouth every 4 (four) hours as needed for severe pain (pain score 7-10). 07/27/23   Yolande Lamar BROCKS, MD  oxyCODONE -acetaminophen  (PERCOCET/ROXICET) 5-325 MG tablet Take 1 tablet by mouth every 6 (six) hours as needed for severe pain. 10/11/22   Cheryle Page, MD  pantoprazole  (PROTONIX ) 40 MG tablet Take one capsule once to twice daily before a meal 11/11/21   Ezzard Sonny RAMAN, PA-C  polyethylene glycol (MIRALAX  MIX-IN PAX) 17 g packet Take 17 grams (one pack) mixed in 4-6 ounces of liquid twice daily until soft bowel movement, then continue once daily thereafter. 04/04/20   Ezzard Sonny RAMAN, PA-C  sucralfate  (CARAFATE ) 1 g tablet Take 1 g by mouth in the morning, at noon, and at bedtime. As needed    [provider]  traMADol  (ULTRAM ) 50 MG tablet Take 1 tablet (50 mg total) by mouth 2 (two) times daily as needed. 01/17/23   Urbano Albright, MD    Allergies: Haloperidol lactate, Morphine  and codeine, Compazine [prochlorperazine], and Thorazine [chlorpromazine]    Review of Systems  Constitutional:  Negative for appetite change and fatigue.  HENT:  Negative for congestion, ear discharge and sinus pressure.   Eyes:  Negative for discharge.  Respiratory:  Negative for cough.   Cardiovascular:  Negative for chest pain.  Gastrointestinal:  Positive for abdominal pain. Negative for diarrhea.  Genitourinary:  Negative for frequency and hematuria.  Musculoskeletal:  Negative for back pain.  Skin:  Negative for rash.  Neurological:  Negative for seizures and headaches.  Psychiatric/Behavioral:  Negative for hallucinations.     Updated Vital Signs BP (!) 152/86 (BP Location: Left Arm)   Pulse 78   Temp 98.3 F (36.8 C) (Oral)   Resp 18   Ht 5' 7 (1.702 m)   Wt 83.9 kg   SpO2 97%    BMI 28.98 kg/m   Physical Exam Vitals and nursing note reviewed.  Constitutional:      Appearance: He is well-developed.  HENT:     Head: Normocephalic.     Nose: Nose normal.   Eyes:     General: No scleral icterus.    Conjunctiva/sclera: Conjunctivae normal.   Neck:     Thyroid: No thyromegaly.   Cardiovascular:     Rate and Rhythm: Normal rate and regular rhythm.     Heart sounds: No murmur heard.    No friction rub. No gallop.  Pulmonary:     Breath sounds: No stridor. No wheezing or rales.  Chest:     Chest wall: No tenderness.  Abdominal:     General: There is no distension.     Tenderness: There is abdominal tenderness. There is no rebound.     Comments: Abdominal wall hematoma.  Appears stable   Musculoskeletal:        General: Normal range of motion.     Cervical back: Neck supple.  Lymphadenopathy:     Cervical: No cervical adenopathy.   Skin:    Findings: No erythema or rash.   Neurological:     Mental Status: He is alert and oriented to person, place, and time.     Motor: No abnormal muscle tone.     Coordination: Coordination normal.   Psychiatric:        Behavior: Behavior normal.     (all labs ordered are listed, but only abnormal results are displayed) Labs Reviewed  CBC WITH DIFFERENTIAL/PLATELET - Abnormal; Notable for the following components:      Result Value   RBC 3.80 (*)    Hemoglobin 11.9 (*)    HCT 33.9 (*)    All other components within normal limits  COMPREHENSIVE METABOLIC PANEL WITH GFR - Abnormal; Notable for the following components:   Glucose, Bld 101 (*)    Creatinine, Ser 0.58 (*)    All other components within normal limits    EKG: None  Radiology: No results found.   Procedures   Medications Ordered in the ED  oxyCODONE -acetaminophen  (PERCOCET/ROXICET) 5-325 MG per tablet 1 tablet (1 tablet Oral Given 07/30/23 1041)  HYDROmorphone  (DILAUDID ) injection 1 mg (1 mg Intramuscular Given 07/30/23 1218)   ondansetron  (ZOFRAN -ODT) disintegrating tablet 4 mg (4 mg Oral Given 07/30/23 1315)                                    Medical Decision Making Amount and/or Complexity of Data Reviewed Labs: ordered.  Risk Prescription drug management.   Abdominal wall hematoma that is stable.  Patient is sent home with some Dilaudid  pills since the Percocet does not seem to be working and he will follow-up with general surgery as suggested last visit  Final diagnoses:  Pain of upper abdomen    ED Discharge Orders          Ordered    HYDROmorphone  (DILAUDID ) 4 MG tablet  Status:  Discontinued        07/30/23 1254    HYDROmorphone  (DILAUDID ) 4 MG tablet  Status:  Discontinued        07/30/23 1420    HYDROmorphone  (DILAUDID ) 4 MG tablet        07/30/23 1542               Suzette Pac, MD 08/01/23 1106

## 2023-08-04 ENCOUNTER — Encounter: Payer: Self-pay | Admitting: Surgery

## 2023-08-04 ENCOUNTER — Ambulatory Visit (INDEPENDENT_AMBULATORY_CARE_PROVIDER_SITE_OTHER): Admitting: Surgery

## 2023-08-04 VITALS — BP 105/71 | HR 72 | Temp 98.4°F | Resp 12 | Ht 67.0 in | Wt 192.0 lb

## 2023-08-04 DIAGNOSIS — S301XXA Contusion of abdominal wall, initial encounter: Secondary | ICD-10-CM | POA: Diagnosis not present
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
## 2023-08-16 ENCOUNTER — Inpatient Hospital Stay: Attending: Oncology | Admitting: Oncology

## 2023-08-16 ENCOUNTER — Inpatient Hospital Stay

## 2023-08-16 VITALS — BP 137/87 | HR 72 | Temp 98.0°F | Resp 18 | Ht 67.0 in | Wt 188.7 lb

## 2023-08-16 DIAGNOSIS — S301XXA Contusion of abdominal wall, initial encounter: Secondary | ICD-10-CM

## 2023-08-16 DIAGNOSIS — R109 Unspecified abdominal pain: Secondary | ICD-10-CM | POA: Insufficient documentation

## 2023-08-16 DIAGNOSIS — Z79899 Other long term (current) drug therapy: Secondary | ICD-10-CM | POA: Diagnosis not present

## 2023-08-16 DIAGNOSIS — D649 Anemia, unspecified: Secondary | ICD-10-CM | POA: Insufficient documentation

## 2023-08-16 LAB — CBC WITH DIFFERENTIAL/PLATELET
Abs Immature Granulocytes: 0.03 K/uL (ref 0.00–0.07)
Basophils Absolute: 0 K/uL (ref 0.0–0.1)
Basophils Relative: 0 %
Eosinophils Absolute: 0 K/uL (ref 0.0–0.5)
Eosinophils Relative: 0 %
HCT: 38.9 % — ABNORMAL LOW (ref 39.0–52.0)
Hemoglobin: 13.1 g/dL (ref 13.0–17.0)
Immature Granulocytes: 0 %
Lymphocytes Relative: 17 %
Lymphs Abs: 1.3 K/uL (ref 0.7–4.0)
MCH: 30 pg (ref 26.0–34.0)
MCHC: 33.7 g/dL (ref 30.0–36.0)
MCV: 89.2 fL (ref 80.0–100.0)
Monocytes Absolute: 0.7 K/uL (ref 0.1–1.0)
Monocytes Relative: 9 %
Neutro Abs: 5.5 K/uL (ref 1.7–7.7)
Neutrophils Relative %: 74 %
Platelets: 233 K/uL (ref 150–400)
RBC: 4.36 MIL/uL (ref 4.22–5.81)
RDW: 12.9 % (ref 11.5–15.5)
WBC: 7.5 K/uL (ref 4.0–10.5)
nRBC: 0 % (ref 0.0–0.2)

## 2023-08-16 LAB — COMPREHENSIVE METABOLIC PANEL WITH GFR
ALT: 14 U/L (ref 0–44)
AST: 17 U/L (ref 15–41)
Albumin: 4.1 g/dL (ref 3.5–5.0)
Alkaline Phosphatase: 102 U/L (ref 38–126)
Anion gap: 11 (ref 5–15)
BUN: 11 mg/dL (ref 8–23)
CO2: 24 mmol/L (ref 22–32)
Calcium: 9.1 mg/dL (ref 8.9–10.3)
Chloride: 101 mmol/L (ref 98–111)
Creatinine, Ser: 0.72 mg/dL (ref 0.61–1.24)
GFR, Estimated: >60 mL/min (ref >60)
Glucose, Bld: 98 mg/dL (ref 70–99)
Potassium: 4.1 mmol/L (ref 3.5–5.1)
Sodium: 136 mmol/L (ref 135–145)
Total Bilirubin: 0.9 mg/dL (ref 0.0–1.2)
Total Protein: 7.4 g/dL (ref 6.5–8.1)

## 2023-08-16 LAB — PROTIME-INR
INR: 1 (ref 0.8–1.2)
Prothrombin Time: 14.1 s (ref 11.4–15.2)

## 2023-08-16 LAB — FIBRINOGEN: Fibrinogen: 338 mg/dL (ref 210–475)

## 2023-08-16 LAB — APTT: aPTT: 39 s — ABNORMAL HIGH (ref 24–36)

## 2023-08-17 LAB — VON WILLEBRAND PANEL
Coagulation Factor VIII: 73 % (ref 56–140)
Ristocetin Co-factor, Plasma: 79 % (ref 50–200)
Von Willebrand Antigen, Plasma: 77 % (ref 50–200)

## 2023-08-17 LAB — COAG STUDIES INTERP REPORT

## 2023-08-23 ENCOUNTER — Ambulatory Visit: Admitting: Surgery
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
## 2023-09-13 ENCOUNTER — Inpatient Hospital Stay: Attending: Oncology | Admitting: Oncology

## 2023-09-13 VITALS — BP 143/95 | HR 75 | Temp 96.9°F | Resp 20 | Wt 188.3 lb

## 2023-09-13 DIAGNOSIS — R109 Unspecified abdominal pain: Secondary | ICD-10-CM | POA: Insufficient documentation

## 2023-09-13 DIAGNOSIS — Z79899 Other long term (current) drug therapy: Secondary | ICD-10-CM | POA: Diagnosis not present

## 2023-09-13 DIAGNOSIS — S301XXA Contusion of abdominal wall, initial encounter: Secondary | ICD-10-CM | POA: Diagnosis not present

## 2024-03-13 ENCOUNTER — Inpatient Hospital Stay

## 2024-03-20 ENCOUNTER — Inpatient Hospital Stay: Admitting: Oncology
